# Patient Record
Sex: Female | Born: 1965 | Race: Asian | Hispanic: No | Marital: Married | Smoking: Never smoker
Health system: Southern US, Community
[De-identification: ages and names within clinical notes are randomized; demographics above are authoritative.]

## PROBLEM LIST (undated history)

## (undated) DIAGNOSIS — T7840XA Allergy, unspecified, initial encounter: Secondary | ICD-10-CM

## (undated) DIAGNOSIS — J45909 Unspecified asthma, uncomplicated: Secondary | ICD-10-CM

## (undated) DIAGNOSIS — R05 Cough: Secondary | ICD-10-CM

## (undated) DIAGNOSIS — R059 Cough, unspecified: Secondary | ICD-10-CM

## (undated) DIAGNOSIS — R0602 Shortness of breath: Secondary | ICD-10-CM

## (undated) HISTORY — DX: Shortness of breath: R06.02

## (undated) HISTORY — DX: Allergy, unspecified, initial encounter: T78.40XA

## (undated) HISTORY — DX: Cough: R05

## (undated) HISTORY — DX: Unspecified asthma, uncomplicated: J45.909

## (undated) HISTORY — PX: UPPER GASTROINTESTINAL ENDOSCOPY: SHX188

## (undated) HISTORY — PX: COLONOSCOPY: SHX174

## (undated) HISTORY — DX: Cough, unspecified: R05.9

---

## 2011-07-14 ENCOUNTER — Ambulatory Visit
Admission: RE | Admit: 2011-07-14 | Discharge: 2011-07-14 | Disposition: A | Discharge status: Home / Self Care | Payer: Self-pay | Source: Ambulatory Visit | Attending: Unknown Physician Specialty | Admitting: Unknown Physician Specialty

## 2011-07-14 ENCOUNTER — Other Ambulatory Visit: Payer: Self-pay | Admitting: Unknown Physician Specialty

## 2011-07-14 DIAGNOSIS — R05 Cough: Secondary | ICD-10-CM

## 2011-07-14 DIAGNOSIS — R053 Chronic cough: Secondary | ICD-10-CM

## 2011-08-14 ENCOUNTER — Other Ambulatory Visit: Payer: BC Managed Care – PPO

## 2011-08-14 ENCOUNTER — Encounter: Payer: Self-pay | Admitting: Critical Care Medicine

## 2011-08-14 ENCOUNTER — Ambulatory Visit (INDEPENDENT_AMBULATORY_CARE_PROVIDER_SITE_OTHER): Payer: BC Managed Care – PPO | Admitting: Critical Care Medicine

## 2011-08-14 ENCOUNTER — Telehealth: Payer: Self-pay | Admitting: Pulmonary Disease

## 2011-08-14 VITALS — BP 112/80 | HR 96 | Temp 98.1°F | Ht 62.0 in | Wt 116.2 lb

## 2011-08-14 DIAGNOSIS — J45902 Unspecified asthma with status asthmaticus: Secondary | ICD-10-CM

## 2011-08-14 DIAGNOSIS — R0602 Shortness of breath: Secondary | ICD-10-CM

## 2011-08-14 DIAGNOSIS — J449 Chronic obstructive pulmonary disease, unspecified: Secondary | ICD-10-CM | POA: Insufficient documentation

## 2011-08-14 DIAGNOSIS — J4541 Moderate persistent asthma with (acute) exacerbation: Secondary | ICD-10-CM | POA: Insufficient documentation

## 2011-08-14 DIAGNOSIS — R05 Cough: Secondary | ICD-10-CM

## 2011-08-14 DIAGNOSIS — R059 Cough, unspecified: Secondary | ICD-10-CM

## 2011-08-14 LAB — IGE: IgE (Immunoglobulin E), Serum: 323 IU/mL — ABNORMAL HIGH (ref 0.0–180.0)

## 2011-08-14 MED ORDER — BUDESONIDE-FORMOTEROL FUMARATE 160-4.5 MCG/ACT IN AERO: 2.0000 | INHALATION_SPRAY | Freq: Two times a day (BID) | RESPIRATORY_TRACT | Status: DC

## 2011-08-14 MED ORDER — AZITHROMYCIN 250 MG PO TABS: 250.0000 mg | ORAL_TABLET | Freq: Every day | ORAL | Status: AC

## 2011-08-14 MED ORDER — PREDNISONE 10 MG PO TABS: ORAL_TABLET | ORAL | Status: DC

## 2011-08-14 NOTE — Telephone Encounter (Signed)
Called and spoke with patient. Patient complaints of worsening breathing, SOB, hot flashes, productive cough with yellow mucus and chest tightness since last Friday.  Patient states she went and saw her family MD and was given antibiotic (doxycycline?) last week. Patient states she does not want to wait until 08/21/11 for scheduled appt with [facility].  Offered appt for today. Patient accepted. Appointment, MD and office address verified for today 08/14/2011 at 315 with Dr. Delford Field.  Nothing further needed at this time.

## 2011-08-14 NOTE — Patient Instructions (Addendum)
Start Symbicort two puff twice daily Take prednisone 10mg  Take 4 for three days 3 for three days 2 for three days 1 for three days and stop Stop levaquin Start Azithromycin 250mg  Take two once then one daily until gone Use Ventolin inhaler as needed only 1-2 puff every 4-6 hours Return [place]  One week  08/21/11

## 2011-08-14 NOTE — Assessment & Plan Note (Signed)
Severe persistent asthma with ongoing airway obstruction and lower airway inflammation. Triggers appear to be that of atopic hypersensitivity Plan Start Symbicort two puff twice daily Take prednisone 10mg  Take 4 for three days 3 for three days 2 for three days 1 for three days and stop Stop levaquin Start Azithromycin 250mg  Take two once then one daily until gone Use Ventolin inhaler as needed only 1-2 puff every 4-6 hours Return [place]  One week  08/21/11

## 2011-08-14 NOTE — Progress Notes (Signed)
Subjective:    Patient ID: Sara Trevino, female    DOB: 08/30/1965, 46 y.o.   MRN: [id number]  HPI Comments: Chronic cough since 10/12.  Then dyspnea worse three days ago and diff with breathing Pt saw ENT 2/13 [place]  Rx nasonex and omeprazole and no help Saw Allergy [facility]: allergy tests Rx Steroid shot and pred pulse and this helped. Company MD Rx Levaquin end of 6/13.   Pt saw PCP over weekend rx benzonatate, doxy, cough syrup, ventolin inhaler.>>this helps some  Cough This is a chronic problem. The current episode started more than 1 month ago. The problem has been rapidly worsening. The problem occurs every few minutes. The cough is productive of purulent sputum. Associated symptoms include chest pain, headaches, nasal congestion, postnasal drip, a sore throat, shortness of breath and wheezing. Pertinent negatives include no chills, ear congestion, ear pain, fever, heartburn, hemoptysis, myalgias, rash or rhinorrhea. The symptoms are aggravated by lying down, dust and fumes. She has tried a beta-agonist inhaler, prescription cough suppressant and OTC cough suppressant for the symptoms. The treatment provided moderate relief. There is no history of asthma, bronchiectasis, COPD, emphysema, environmental allergies or pneumonia. skin testing was not remarkable  Shortness of Breath This is a new problem. The current episode started in the past 7 days. The problem occurs daily. The problem has been rapidly worsening. Associated symptoms include chest pain, headaches, orthopnea, PND, a sore throat, vomiting and wheezing. Pertinent negatives include no abdominal pain, ear pain, fever, hemoptysis, leg swelling, neck pain, rash or rhinorrhea. The symptoms are aggravated by any activity, eating, lying flat, exercise, smoke and weather changes. She has tried beta agonist inhalers and oral steroids for the symptoms. The treatment provided moderate relief. There is no history of allergies, aspirin  allergies, asthma, chronic lung disease, COPD, a heart failure, PE or pneumonia. (Skin testing was not remarkable)   Past Medical History  Diagnosis Date  . Cough   . SOB (shortness of breath)      Family History  Problem Relation Age of Onset  . Positive PPD/TB Exposure Mother      History   Social History  . Marital Status: Married    Spouse Name: N/A    Number of Children: 1  . 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 Smoking status: Never Smoker   . Smokeless tobacco: Never Used  . Alcohol Use: No  . Drug Use: No  . Sexually Active: Not on file   Other Topics Concern  . Not on file   Social History Narrative  . No narrative on file     No Known Allergies   No outpatient prescriptions prior to visit.       Review of Systems  Constitutional: Positive for diaphoresis, activity change, appetite change and fatigue. Negative for fever, chills and unexpected weight change.  HENT: Positive for congestion, sore throat and postnasal drip. Negative for hearing loss, ear pain, nosebleeds, facial swelling, rhinorrhea, sneezing, mouth sores, trouble swallowing, neck pain, neck stiffness, dental problem, voice change, sinus pressure, tinnitus and ear discharge.   Eyes: Negative for photophobia, discharge, itching and visual disturbance.  Respiratory: Positive for cough, chest tightness, shortness of breath and wheezing. Negative for apnea, hemoptysis, choking and stridor.   Cardiovascular: Positive for chest pain, orthopnea and PND. Negative for palpitations and leg swelling.  Gastrointestinal: Positive for vomiting and abdominal distention. Negative for  heartburn, nausea, abdominal pain, constipation and blood in stool.  Genitourinary: Negative for dysuria, urgency, frequency, hematuria, flank pain, decreased urine volume and difficulty urinating.  Musculoskeletal: Negative for myalgias, back pain, joint  swelling, arthralgias and gait problem.  Skin: Negative for color change, pallor and rash.  Neurological: Positive for weakness and headaches. Negative for dizziness, tremors, seizures, syncope, speech difficulty, light-headedness and numbness.  Hematological: Negative for environmental allergies and adenopathy. Does not bruise/bleed easily.  Psychiatric/Behavioral: Positive for disturbed wake/sleep cycle. Negative for confusion and agitation. The patient is not nervous/anxious.        Objective:   Physical Exam  Filed Vitals:   08/14/11 1501  BP: 112/80  Pulse: 96  Temp: 98.1 F (36.7 C)  TempSrc: Oral  Height: 5\' 2"  (1.575 m)  Weight: 116 lb 3.2 oz (52.708 kg)  SpO2: 95%    Gen: Pleasant, well-nourished, in no distress,  normal affect  ENT: No lesions,  mouth clear,  oropharynx clear, no postnasal drip  Neck: No JVD, no TMG, no carotid bruits  Lungs: No use of accessory muscles, no dullness to percussion, expired and expiratory wheezes poor airflow  Cardiovascular: RRR, heart sounds normal, no murmur or gallops, no peripheral edema  Abdomen: soft and NT, no HSM,  BS normal  Musculoskeletal: No deformities, no cyanosis or clubbing  Neuro: alert, non focal  Skin: Warm, no lesions or rashes  Chest x-ray from June 2013-  revealed airway hyperinflation but no acute infiltrates      Assessment & Plan:   Extrinsic asthma with status asthmaticus Severe persistent asthma with ongoing airway obstruction and lower airway inflammation. Triggers appear to be that of atopic hypersensitivity Plan Start Symbicort two puff twice daily Take prednisone 10mg  Take 4 for three days 3 for three days 2 for three days 1 for three days and stop Stop levaquin Start Azithromycin 250mg  Take two once then one daily until gone Use Ventolin inhaler as needed only 1-2 puff every 4-6 hours Return [place]  One week  08/21/11    Updated Medication List Outpatient Encounter Prescriptions as  of 08/14/2011  Medication Sig Dispense Refill  . albuterol (VENTOLIN HFA) 108 (90 BASE) MCG/ACT inhaler Inhale 2 puffs into the lungs every 6 (six) hours as needed.      . benzonatate (TESSALON) 100 MG capsule Take by mouth every 12 (twelve) hours. UNSURE OF MG      . promethazine-codeine (PHENERGAN WITH CODEINE) 6.25-10 MG/5ML syrup Take by mouth at bedtime as needed.      Marland Kitchen azithromycin (ZITHROMAX) 250 MG tablet Take 1 tablet (250 mg total) by mouth daily. Take two once then one daily until gone  6 each  0  . budesonide-formoterol (SYMBICORT) 160-4.5 MCG/ACT inhaler Inhale 2 puffs into the lungs 2 (two) times daily.  1 Inhaler  12  . predniSONE (DELTASONE) 10 MG tablet Take 4 for three days 3 for three days 2 for three days 1 for three days and stop  30 tablet  0

## 2011-08-15 NOTE — Progress Notes (Signed)
Quick Note:  Called, spoke with pt. I informed her of lab results and recs per Dr. Delford Field. She is aware to cont with meds as rx on July 29 OV. I reminded her of pending OV with PW on Aug 5 in [place]. She verbalized understanding of these results and instructions. ______

## 2011-08-21 ENCOUNTER — Institutional Professional Consult (permissible substitution): Payer: Self-pay | Admitting: Pulmonary Disease

## 2011-08-21 ENCOUNTER — Ambulatory Visit (INDEPENDENT_AMBULATORY_CARE_PROVIDER_SITE_OTHER): Payer: BC Managed Care – PPO | Admitting: Critical Care Medicine

## 2011-08-21 ENCOUNTER — Encounter: Payer: Self-pay | Admitting: Critical Care Medicine

## 2011-08-21 VITALS — BP 102/68 | HR 73 | Temp 98.0°F | Ht 62.0 in | Wt 120.0 lb

## 2011-08-21 DIAGNOSIS — J45909 Unspecified asthma, uncomplicated: Secondary | ICD-10-CM

## 2011-08-21 NOTE — Assessment & Plan Note (Signed)
Severe persistent asthma with significant atopic features. IgE level greater than 300. Multiple positive allergens on recent skin testing. Plan Case discussed with patient's allergist. Consideration for Xolair therapy should be given in this case. Patient to maintain Symbicort daily Finish prednisone taper Return 2 months

## 2011-08-21 NOTE — Progress Notes (Signed)
Subjective:    Patient ID: Sara Trevino, female    DOB: 08/03/1965, 46 y.o.   MRN: [id number]  HPI Comments: Chronic cough since 10/12.  Then dyspnea worse three days ago and diff with breathing Pt saw ENT 2/13 [place]  Rx nasonex and omeprazole and no help Saw Allergy Sharma: allergy tests Rx Steroid shot and pred pulse and this helped. Company MD Rx Levaquin end of 6/13.   Pt saw PCP over weekend rx benzonatate, doxy, cough syrup, ventolin inhaler.>>this helps some  Cough This is a chronic problem. The current episode started more than 1 month ago. The problem has been rapidly worsening. The problem occurs every few minutes. The cough is productive of purulent sputum. Associated symptoms include chest pain, headaches, nasal congestion, postnasal drip, a sore throat, shortness of breath and wheezing. Pertinent negatives include no chills, ear congestion, ear pain, fever, heartburn, hemoptysis, myalgias, rash or rhinorrhea. The symptoms are aggravated by lying down, dust and fumes. She has tried a beta-agonist inhaler, prescription cough suppressant and OTC cough suppressant for the symptoms. The treatment provided moderate relief. There is no history of asthma, bronchiectasis, COPD, emphysema, environmental allergies or pneumonia. skin testing was not remarkable  Shortness of Breath This is a new problem. The current episode started in the past 7 days. The problem occurs daily. The problem has been rapidly worsening. Associated symptoms include chest pain, headaches, orthopnea, PND, a sore throat, vomiting and wheezing. Pertinent negatives include no abdominal pain, ear pain, fever, hemoptysis, leg swelling, neck pain, rash or rhinorrhea. The symptoms are aggravated by any activity, eating, lying flat, exercise, smoke and weather changes. She has tried beta agonist inhalers and oral steroids for the symptoms. The treatment provided moderate relief. There is no history of allergies, aspirin  allergies, asthma, chronic lung disease, COPD, a heart failure, PE or pneumonia. (Skin testing was not remarkable)  08/21/2011 Pt is coughing less and is better. On prednisone on a taper.  Ventolin is prn and not being used  PUL ASTHMA HISTORY 08/21/2011  Symptoms >2 days/week  Nighttime awakenings 0-2/month  Interference with activity Minor limitations  SABA use 0-2 days/wk  Exacerbations requiring oral steroids 0-1 / year     Past Medical History  Diagnosis Date  . Cough   . SOB (shortness of breath)      Family History  Problem Relation Age of Onset  . Positive PPD/TB Exposure Mother      History   Social History  . Marital Status: Married    Spouse Name: N/A    Number of Children: 1  . 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 Smoking status: Never Smoker   . Smokeless tobacco: Never Used  . Alcohol Use: No  . Drug Use: No  . Sexually Active: Not on file   Other Topics Concern  . Not on file   Social History Narrative  . No narrative on file     No Known Allergies   Outpatient Prescriptions Prior to Visit  Medication Sig Dispense Refill  . albuterol (VENTOLIN HFA) 108 (90 BASE) MCG/ACT inhaler Inhale 2 puffs into the lungs every 6 (six) hours as needed.      . benzonatate (TESSALON) 100 MG capsule Take by mouth daily. UNSURE OF MG      . budesonide-formoterol (SYMBICORT) 160-4.5 MCG/ACT inhaler Inhale 2 puffs into the lungs 2 (two) times daily.  1  Inhaler  12  . predniSONE (DELTASONE) 10 MG tablet Take 4 for three days 3 for three days 2 for three days 1 for three days and stop  30 tablet  0  . promethazine-codeine (PHENERGAN WITH CODEINE) 6.25-10 MG/5ML syrup Take by mouth at bedtime as needed.           Review of Systems  Constitutional: Positive for diaphoresis, activity change, appetite change and fatigue. Negative for fever, chills and unexpected weight change.  HENT: Positive for  congestion, sore throat and postnasal drip. Negative for hearing loss, ear pain, nosebleeds, facial swelling, rhinorrhea, sneezing, mouth sores, trouble swallowing, neck pain, neck stiffness, dental problem, voice change, sinus pressure, tinnitus and ear discharge.   Eyes: Negative for photophobia, discharge, itching and visual disturbance.  Respiratory: Positive for cough, chest tightness, shortness of breath and wheezing. Negative for apnea, hemoptysis, choking and stridor.   Cardiovascular: Positive for chest pain, orthopnea and PND. Negative for palpitations and leg swelling.  Gastrointestinal: Positive for vomiting and abdominal distention. Negative for heartburn, nausea, abdominal pain, constipation and blood in stool.  Genitourinary: Negative for dysuria, urgency, frequency, hematuria, flank pain, decreased urine volume and difficulty urinating.  Musculoskeletal: Negative for myalgias, back pain, joint swelling, arthralgias and gait problem.  Skin: Negative for color change, pallor and rash.  Neurological: Positive for weakness and headaches. Negative for dizziness, tremors, seizures, syncope, speech difficulty, light-headedness and numbness.  Hematological: Negative for environmental allergies and adenopathy. Does not bruise/bleed easily.  Psychiatric/Behavioral: Positive for disturbed wake/sleep cycle. Negative for confusion and agitation. The patient is not nervous/anxious.        Objective:   Physical Exam   Filed Vitals:   08/21/11 0848  BP: 102/68  Pulse: 73  Temp: 98 F (36.7 C)  TempSrc: Oral  Height: 5\' 2"  (1.575 m)  Weight: 120 lb (54.432 kg)  SpO2: 98%    Gen: Pleasant, well-nourished, in no distress,  normal affect  ENT: No lesions,  mouth clear,  oropharynx clear, no postnasal drip  Neck: No JVD, no TMG, no carotid bruits  Lungs: No use of accessory muscles, no dullness to percussion, improved airflow airflow  Cardiovascular: RRR, heart sounds normal, no  murmur or gallops, no peripheral edema  Abdomen: soft and NT, no HSM,  BS normal  Musculoskeletal: No deformities, no cyanosis or clubbing  Neuro: alert, non focal  Skin: Warm, no lesions or rashes  Chest x-ray from June 2013-  revealed airway hyperinflation but no acute infiltrates      Assessment & Plan:   Extrinsic asthma, unspecified Severe persistent asthma with significant atopic features. IgE level greater than 300. Multiple positive allergens on recent skin testing. Plan Case discussed with patient's allergist. Consideration for Xolair therapy should be given in this case. Patient to maintain Symbicort daily Finish prednisone taper Return 2 months   note we used specific interpreter so the patient can have a full understanding of her medical condition recommendation for treatments. It appears the patient only understands broken [place]. It will be important this patient have an interpreter in each office visit.  Updated Medication List Outpatient Encounter Prescriptions as of 08/21/2011  Medication Sig Dispense Refill  . albuterol (VENTOLIN HFA) 108 (90 BASE) MCG/ACT inhaler Inhale 2 puffs into the lungs every 6 (six) hours as needed.      . benzonatate (TESSALON) 100 MG capsule Take by mouth daily. UNSURE OF MG      . budesonide-formoterol (SYMBICORT) 160-4.5 MCG/ACT inhaler Inhale 2 puffs into  the lungs 2 (two) times daily.  1 Inhaler  12  . predniSONE (DELTASONE) 10 MG tablet Take 4 for three days 3 for three days 2 for three days 1 for three days and stop  30 tablet  0  . DISCONTD: promethazine-codeine (PHENERGAN WITH CODEINE) 6.25-10 MG/5ML syrup Take by mouth at bedtime as needed.

## 2011-08-21 NOTE — Patient Instructions (Signed)
Finish prednisone Stay on Symbicort, work on inhaler technique Call Dr [place] Callas for follow up to consider either Xolair or allergy shots Return 3 months [place] office

## 2011-08-22 ENCOUNTER — Telehealth: Payer: Self-pay | Admitting: Critical Care Medicine

## 2011-08-22 NOTE — Telephone Encounter (Signed)
Received 8 pages from [facility]. 08/22/2011/SD

## 2011-08-30 ENCOUNTER — Encounter (HOSPITAL_BASED_OUTPATIENT_CLINIC_OR_DEPARTMENT_OTHER): Payer: Self-pay | Admitting: Emergency Medicine

## 2011-08-30 ENCOUNTER — Emergency Department (HOSPITAL_BASED_OUTPATIENT_CLINIC_OR_DEPARTMENT_OTHER)
Admission: EM | Admit: 2011-08-30 | Discharge: 2011-08-30 | Disposition: A | Discharge status: Home / Self Care | Payer: BC Managed Care – PPO | Attending: Emergency Medicine | Admitting: Emergency Medicine

## 2011-08-30 DIAGNOSIS — S61209A Unspecified open wound of unspecified finger without damage to nail, initial encounter: Secondary | ICD-10-CM | POA: Insufficient documentation

## 2011-08-30 DIAGNOSIS — S61219A Laceration without foreign body of unspecified finger without damage to nail, initial encounter: Secondary | ICD-10-CM

## 2011-08-30 DIAGNOSIS — W260XXA Contact with knife, initial encounter: Secondary | ICD-10-CM | POA: Insufficient documentation

## 2011-08-30 NOTE — ED Notes (Signed)
Pt cut left thumb with kitchen knife while cutting a melon. Bleeding controlled.

## 2011-08-30 NOTE — ED Provider Notes (Signed)
History     CSN: [id number]  Arrival date & time 08/30/11  1859   First MD Initiated Contact with Patient 08/30/11 1924      Chief Complaint  Patient presents with  . Extremity Laceration    (Consider location/radiation/quality/duration/timing/severity/associated sxs/prior treatment) HPI Comments: Patient states she was cutting a watermelon this afternoon when the knife slipped and she cut her left thumb. The patient denies being on any blood thinning type medications. She's not had any previous procedures involving the left thumb. She has been able to control the bleeding with pressure. No other injury or problem reported.  The history is provided by the patient and the spouse.    Past Medical History  Diagnosis Date  . Cough   . SOB (shortness of breath)     History reviewed. No pertinent past surgical history.  Family History  Problem Relation Age of Onset  . Positive PPD/TB Exposure Mother     History  Substance Use Topics  . Smoking status: Never Smoker   . Smokeless tobacco: Never Used  . Alcohol Use: No    OB History    Grav Para Term Preterm Abortions TAB SAB Ect Mult Living                  Review of Systems  Constitutional: Negative for activity change.       All ROS Neg except as noted in HPI  HENT: Negative for nosebleeds and neck pain.   Eyes: Negative for photophobia and discharge.  Respiratory: Positive for cough and shortness of breath. Negative for wheezing.   Cardiovascular: Negative for chest pain and palpitations.  Gastrointestinal: Negative for abdominal pain and blood in stool.  Genitourinary: Negative for dysuria, frequency and hematuria.  Musculoskeletal: Negative for back pain and arthralgias.  Skin: Negative.   Neurological: Negative for dizziness, seizures and speech difficulty.  Psychiatric/Behavioral: Negative for hallucinations and confusion.    Allergies  Review of patient's allergies indicates no known allergies.  Home  Medications   Current Outpatient Rx  Name Route Sig Dispense Refill  . ALBUTEROL SULFATE HFA 108 (90 BASE) MCG/ACT IN AERS Inhalation Inhale 2 puffs into the lungs every 6 (six) hours as needed. For wheezing and shortness of breath.    . BUDESONIDE-FORMOTEROL FUMARATE 160-4.5 MCG/ACT IN AERO Inhalation Inhale 2 puffs into the lungs 2 (two) times daily. 1 Inhaler 12    BP 114/65  Pulse 68  Temp 98.6 F (37 C) (Oral)  Resp 18  Ht 5\' 2"  (1.575 m)  Wt 116 lb (52.617 kg)  BMI 21.22 kg/m2  SpO2 100%  LMP 08/23/2011  Physical Exam  Nursing note and vitals reviewed. Constitutional: She is oriented to person, place, and time. She appears well-developed and well-nourished.  Non-toxic appearance.  HENT:  Head: Normocephalic.  Right Ear: Tympanic membrane and external ear normal.  Left Ear: Tympanic membrane and external ear normal.  Eyes: EOM and lids are normal. Pupils are equal, round, and reactive to light.  Neck: Normal range of motion. Neck supple. Carotid bruit is not present.  Cardiovascular: Normal rate, regular rhythm, normal heart sounds, intact distal pulses and normal pulses.   Pulmonary/Chest: Breath sounds normal. No respiratory distress.  Abdominal: Soft. Bowel sounds are normal. There is no tenderness. There is no guarding.  Musculoskeletal: Normal range of motion.       Laceration to the lateral left thumb. No bone or tendon involvement. FROM of the thumb. Sensory wnl.  Lymphadenopathy:  Head (right side): No submandibular adenopathy present.       Head (left side): No submandibular adenopathy present.    She has no cervical adenopathy.  Neurological: She is alert and oriented to person, place, and time. She has normal strength. No cranial nerve deficit or sensory deficit.  Skin: Skin is warm and dry.  Psychiatric: She has a normal mood and affect. Her speech is normal.    ED Course  Procedures : LACERATION REPAIR LEFT THUMB - patient identified by arm band.  Permission for the procedure given by the patient. Procedural time out taken before repair of laceration to the left thumb. The left palm was cleansed with Hibiclens. The wound was repaired with Steri-Strips and tincture of benzoin with good wound edge approximation. The bleeding stopped, and the patient tolerated the procedure without any problem.  Labs Reviewed - No data to display No results found.   1. Laceration of finger       MDM  I have reviewed nursing notes, vital signs, and all appropriate lab and imaging results for this patient. Patient sustained a laceration to the left thumb while cutting watermelon. The wound was repaired with Steri-Strips and tincture of benzoin. The patient has been advised on monitoring the wound for any signs of infection. She is to return if any changes, problems, or concerns.       Kathie Dike, [place] 08/30/11 2113

## 2011-09-03 NOTE — ED Provider Notes (Signed)
Medical screening examination/treatment/procedure(s) were performed by non-physician practitioner and as supervising physician I was immediately available for consultation/collaboration.   Shelda Jakes, MD 09/03/11 [phone]

## 2011-09-19 ENCOUNTER — Telehealth: Payer: Self-pay | Admitting: Critical Care Medicine

## 2011-09-19 NOTE — Telephone Encounter (Signed)
Forward 4 pages from [facility] to Dr. Shan Levans for review on 09-19-11 ym

## 2011-12-28 ENCOUNTER — Ambulatory Visit (INDEPENDENT_AMBULATORY_CARE_PROVIDER_SITE_OTHER): Payer: BC Managed Care – PPO | Admitting: Critical Care Medicine

## 2011-12-28 ENCOUNTER — Encounter: Payer: Self-pay | Admitting: Critical Care Medicine

## 2011-12-28 VITALS — BP 114/64 | HR 80 | Temp 97.7°F | Ht 62.0 in | Wt 124.0 lb

## 2011-12-28 DIAGNOSIS — J45909 Unspecified asthma, uncomplicated: Secondary | ICD-10-CM

## 2011-12-28 MED ORDER — MOMETASONE FUROATE 220 MCG/INH IN AEPB: 2.0000 | INHALATION_SPRAY | Freq: Every day | RESPIRATORY_TRACT | Status: DC

## 2011-12-28 MED ORDER — AZELASTINE-FLUTICASONE 137-50 MCG/ACT NA SUSP: 1.0000 | Freq: Two times a day (BID) | NASAL | Status: DC

## 2011-12-28 NOTE — Patient Instructions (Signed)
Stop nasonex Try Dymista one puff twice daily Stop Symbicort Start Asmanex two puff daily Return 4 months

## 2011-12-28 NOTE — Progress Notes (Signed)
Subjective:    Patient ID: Sara Trevino, female    DOB: May 06, 1965, 46 y.o.   MRN: [id number]  Shortness of Breath This is a new problem. The current episode started in the past 7 days. The problem occurs daily. The problem has been rapidly worsening. Associated symptoms include orthopnea, PND and vomiting. Pertinent negatives include no abdominal pain, leg swelling or neck pain. The symptoms are aggravated by any activity, eating, lying flat, exercise, smoke and weather changes. She has tried beta agonist inhalers and oral steroids for the symptoms. The treatment provided moderate relief. There is no history of allergies, aspirin allergies, chronic lung disease, a heart failure or PE.  08/21/2011 Pt is coughing less and is better. On prednisone on a taper.  Ventolin is prn and not being used   12/28/2011 Breathing is ok, Pt still with pndrip. In the AM has yellow mucus. This is an issue for one week. No chest tightness.  No cough. No wheeze.  No SABA use, just symbicort. Started nasonex one week ago ? If helps.  PUL ASTHMA HISTORY 08/21/2011  Symptoms >2 days/week  Nighttime awakenings 0-2/month  Interference with activity Minor limitations  SABA use 0-2 days/wk  Exacerbations requiring oral steroids 0-1 / year     Past Medical History  Diagnosis Date  . Cough   . SOB (shortness of breath)      Family History  Problem Relation Age of Onset  . Positive PPD/TB Exposure Mother      History   Social History  . Marital Status: Married    Spouse Name: N/A    Number of Children: 1  . 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 Smoking status: Never Smoker   . Smokeless tobacco: Never Used  . Alcohol Use: No  . Drug Use: No  . Sexually Active: Not on file   Other Topics Concern  . Not on file   Social History Narrative  . No narrative on file     No Known Allergies   Outpatient Prescriptions Prior to Visit   Medication Sig Dispense Refill  . albuterol (VENTOLIN HFA) 108 (90 BASE) MCG/ACT inhaler Inhale 2 puffs into the lungs every 6 (six) hours as needed. For wheezing and shortness of breath.      . [DISCONTINUED] budesonide-formoterol (SYMBICORT) 160-4.5 MCG/ACT inhaler Inhale 2 puffs into the lungs 2 (two) times daily.  1 Inhaler  12  Last reviewed on 12/28/2011  4:13 PM by Storm Frisk, MD     Review of Systems  Constitutional: Positive for diaphoresis, activity change, appetite change and fatigue. Negative for unexpected weight change.  HENT: Positive for congestion. Negative for hearing loss, nosebleeds, facial swelling, sneezing, mouth sores, trouble swallowing, neck pain, neck stiffness, dental problem, voice change, sinus pressure, tinnitus and ear discharge.   Eyes: Negative for photophobia, discharge, itching and visual disturbance.  Respiratory: Negative for apnea, choking, chest tightness and stridor.   Cardiovascular: Positive for orthopnea and PND. Negative for palpitations and leg swelling.  Gastrointestinal: Positive for vomiting and abdominal distention. Negative for nausea, abdominal pain, constipation and blood in stool.  Genitourinary: Negative for dysuria, urgency, frequency, hematuria, flank pain, decreased urine volume and difficulty urinating.  Musculoskeletal: Negative for back pain, joint swelling, arthralgias and gait problem.  Skin: Negative for color change and pallor.  Neurological: Positive for weakness. Negative for dizziness, tremors, seizures, syncope, speech difficulty, light-headedness  and numbness.  Hematological: Negative for adenopathy. Does not bruise/bleed easily.  Psychiatric/Behavioral: Positive for sleep disturbance. Negative for confusion and agitation. The patient is not nervous/anxious.        Objective:   Physical Exam   Filed Vitals:   12/28/11 1552  BP: 114/64  Pulse: 80  Temp: 97.7 F (36.5 C)  TempSrc: Oral  Height: 5\' 2"  (1.575  m)  Weight: 124 lb (56.246 kg)  SpO2: 99%    Gen: Pleasant, well-nourished, in no distress,  normal affect  ENT: No lesions,  mouth clear,  oropharynx clear, no postnasal drip  Neck: No JVD, no TMG, no carotid bruits  Lungs: No use of accessory muscles, no dullness to percussion, improved airflow airflow  Cardiovascular: RRR, heart sounds normal, no murmur or gallops, no peripheral edema  Abdomen: soft and NT, no HSM,  BS normal  Musculoskeletal: No deformities, no cyanosis or clubbing  Neuro: alert, non focal  Skin: Warm, no lesions or rashes  Chest x-ray from June 2013-  revealed airway hyperinflation but no acute infiltrates      Assessment & Plan:   Extrinsic asthma, unspecified Persistent asthma with improvement Plan Discontinue Symbicort Begin Asmanex 2 puff daily Trial of Dymista  1 spray each nostril twice daily in place of Nasonex    Updated Medication List Outpatient Encounter Prescriptions as of 12/28/2011  Medication Sig Dispense Refill  . albuterol (VENTOLIN HFA) 108 (90 BASE) MCG/ACT inhaler Inhale 2 puffs into the lungs every 6 (six) hours as needed. For wheezing and shortness of breath.      . Multiple Vitamin (MULTIVITAMIN) tablet Take 1 tablet by mouth daily.      Marland Kitchen PRESCRIPTION MEDICATION Allergy injections once weekly      . [DISCONTINUED] budesonide-formoterol (SYMBICORT) 160-4.5 MCG/ACT inhaler Inhale 2 puffs into the lungs 2 (two) times daily.  1 Inhaler  12  . [DISCONTINUED] budesonide-formoterol (SYMBICORT) 160-4.5 MCG/ACT inhaler Inhale 1 puff into the lungs daily.      . [DISCONTINUED] mometasone (NASONEX) 50 MCG/ACT nasal spray Place 2 sprays into the nose at bedtime.      . Azelastine-Fluticasone (DYMISTA) 137-50 MCG/ACT SUSP Place 1 puff into the nose 2 (two) times daily.  23 g  1  . mometasone (ASMANEX 120 METERED DOSES) 220 MCG/INH inhaler Inhale 2 puffs into the lungs daily.  1 Inhaler  12

## 2011-12-29 NOTE — Assessment & Plan Note (Signed)
Persistent asthma with improvement Plan Discontinue Symbicort Begin Asmanex 2 puff daily Trial of Dymista  1 spray each nostril twice daily in place of Nasonex

## 2012-01-01 ENCOUNTER — Ambulatory Visit: Payer: BC Managed Care – PPO | Admitting: Critical Care Medicine

## 2012-05-01 ENCOUNTER — Telehealth: Payer: Self-pay | Admitting: Critical Care Medicine

## 2012-05-01 NOTE — Telephone Encounter (Signed)
Called patient 3x to schd follow up apt. No return call back. Sent letter 05/01/12. °

## 2014-01-05 ENCOUNTER — Encounter (INDEPENDENT_AMBULATORY_CARE_PROVIDER_SITE_OTHER): Payer: Self-pay

## 2014-01-05 ENCOUNTER — Encounter: Payer: Self-pay | Admitting: Critical Care Medicine

## 2014-01-05 ENCOUNTER — Ambulatory Visit (INDEPENDENT_AMBULATORY_CARE_PROVIDER_SITE_OTHER): Payer: BC Managed Care – PPO | Admitting: Critical Care Medicine

## 2014-01-05 VITALS — BP 100/62 | HR 71 | Temp 98.1°F | Ht 62.0 in | Wt 121.4 lb

## 2014-01-05 DIAGNOSIS — Z23 Encounter for immunization: Secondary | ICD-10-CM

## 2014-01-05 DIAGNOSIS — J0191 Acute recurrent sinusitis, unspecified: Secondary | ICD-10-CM

## 2014-01-05 DIAGNOSIS — J4541 Moderate persistent asthma with (acute) exacerbation: Secondary | ICD-10-CM

## 2014-01-05 MED ORDER — CHLORPHENIRAMINE MALEATE 4 MG PO TABS: 12.0000 mg | ORAL_TABLET | Freq: Every day | ORAL | Status: DC

## 2014-01-05 MED ORDER — PREDNISONE 10 MG PO TABS: ORAL_TABLET | ORAL | Status: DC

## 2014-01-05 MED ORDER — BENZONATATE 100 MG PO CAPS: 100.0000 mg | ORAL_CAPSULE | Freq: Four times a day (QID) | ORAL | Status: DC | PRN

## 2014-01-05 NOTE — Progress Notes (Signed)
Subjective:    Patient ID: Sara Trevino, female    DOB: 04/16/1965, 48 y.o.   MRN: [id number]  HPI Comments: Hx of cough chronic referred back by allergy MD, pt is on allergy immunotherapy.  Pt sees McIntosh CallasSharma.  Also has seen Dr Newman PiesSu Teoh of ENT who said no surgery needed   Cough This is a chronic problem. The current episode started more than 1 month ago. The problem has been unchanged. The cough is productive of sputum (yellow to white mucus). Associated symptoms include nasal congestion, postnasal drip, a sore throat, shortness of breath and wheezing. Pertinent negatives include no chest pain, chills, ear congestion, ear pain, fever, headaches, heartburn, hemoptysis, rash, rhinorrhea, sweats or weight loss. Associated symptoms comments: Notes qhs cough. The symptoms are aggravated by cold air, fumes, exercise, dust, pollens, lying down and stress. Risk factors for lung disease include smoking/tobacco exposure. She has tried steroid inhaler, a beta-agonist inhaler and oral steroids (symbicort did not help>>now on breo , ABX and steroids help but not relieved ) for the symptoms. The treatment provided moderate relief. Her past medical history is significant for asthma and environmental allergies.    PUL ASTHMA HISTORY 08/21/2011  Symptoms >2 days/week  Nighttime awakenings 0-2/month  Interference with activity Minor limitations  SABA use 0-2 days/wk  Exacerbations requiring oral steroids 0-1 / year     Past Medical History  Diagnosis Date  . Cough   . SOB (shortness of breath)      Family History  Problem Relation Age of Onset  . Positive PPD/TB Exposure Mother      History   Social History  . Marital Status: Married    Spouse Name: N/A    Number of Children: 1  . 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 Smoking status: Never Smoker   . Smokeless tobacco: Never Used  . Alcohol Use: No  . Drug Use: No  . Sexual  Activity: Not on file   Other Topics Concern  . Not on file   Social History Narrative     No Known Allergies   Outpatient Prescriptions Prior to Visit  Medication Sig Dispense Refill  . albuterol (VENTOLIN HFA) 108 (90 BASE) MCG/ACT inhaler Inhale 2 puffs into the lungs every 6 (six) hours as needed. For wheezing and shortness of breath.    . Multiple Vitamin (MULTIVITAMIN) tablet Take 1 tablet by mouth daily.    Marland Kitchen. PRESCRIPTION MEDICATION Allergy injections once weekly    . Azelastine-Fluticasone (DYMISTA) 137-50 MCG/ACT SUSP Place 1 puff into the nose 2 (two) times daily. 23 g 1  . mometasone (ASMANEX 120 METERED DOSES) 220 MCG/INH inhaler Inhale 2 puffs into the lungs daily. (Patient not taking: Reported on 01/05/2014) 1 Inhaler 12   No facility-administered medications prior to visit.       Review of Systems  Constitutional: Positive for diaphoresis, activity change, appetite change and fatigue. Negative for fever, chills, weight loss and unexpected weight change.  HENT: Positive for congestion, postnasal drip and sore throat. Negative for dental problem, ear discharge, ear pain, facial swelling, hearing loss, mouth sores, nosebleeds, rhinorrhea, sinus pressure, sneezing, tinnitus, trouble swallowing and voice change.   Eyes: Negative for photophobia, discharge, itching and visual disturbance.  Respiratory: Positive for cough, shortness of breath and wheezing. Negative for apnea, hemoptysis, choking, chest tightness and stridor.   Cardiovascular: Negative for chest pain and palpitations.  Gastrointestinal: Positive for abdominal distention. Negative for heartburn, nausea, constipation and blood in stool.  Genitourinary: Negative for dysuria, urgency, frequency, hematuria, flank pain, decreased urine volume and difficulty urinating.  Musculoskeletal: Negative for back pain, joint swelling, arthralgias, gait problem and neck stiffness.  Skin: Negative for color change, pallor and  rash.  Allergic/Immunologic: Positive for environmental allergies.  Neurological: Positive for weakness. Negative for dizziness, tremors, seizures, syncope, speech difficulty, light-headedness, numbness and headaches.  Hematological: Negative for adenopathy. Does not bruise/bleed easily.  Psychiatric/Behavioral: Positive for sleep disturbance. Negative for confusion and agitation. The patient is not nervous/anxious.        Objective:   Physical Exam   Filed Vitals:   01/05/14 0943  BP: 100/62  Pulse: 71  Temp: 98.1 F (36.7 C)  TempSrc: Oral  Height: 5\' 2"  (1.575 m)  Weight: 55.067 kg (121 lb 6.4 oz)  SpO2: 98%    Gen: Pleasant, well-nourished, in no distress,  normal affect  ENT: No lesions,  mouth clear,  oropharynx clear, no postnasal drip  Neck: No JVD, no TMG, no carotid bruits  Lungs: No use of accessory muscles, no dullness to percussion, improved airflow airflow  Cardiovascular: RRR, heart sounds normal, no murmur or gallops, no peripheral edema  Abdomen: soft and NT, no HSM,  BS normal  Musculoskeletal: No deformities, no cyanosis or clubbing  Neuro: alert, non focal  Skin: Warm, no lesions or rashes  CT chest 2015: normal CT Sinus: 2014: polyposis, mucosal thickening      Assessment & Plan:   Moderate persistent asthma with exacerbation Moderate persistent asthma with exacerabation Plan Use tessalon perles as needed for cough 1 - 2 every 4-6 hours Recycle prednisone 10mg  Take 4 for four days 3 for four days 2 for four days 1 for four days Finish Biaxin Stay on Breo Stay on Qnasl Use chlorpheniramine 4mg  : 12mg  at bedtime A CT Sinus will be obtained Return 2 weeks recheck Tammy Parrett     Updated Medication List Outpatient Encounter Prescriptions as of 01/05/2014  Medication Sig  . albuterol (VENTOLIN HFA) 108 (90 BASE) MCG/ACT inhaler Inhale 2 puffs into the lungs every 6 (six) hours as needed. For wheezing and shortness of breath.  .  Beclomethasone Dipropionate (QNASL) 80 MCG/ACT AERS Place 2 puffs into the nose daily.  Marland Kitchen. BREO ELLIPTA 200-25 MCG/INH AEPB Inhale 1 puff into the lungs daily.  . clarithromycin (BIAXIN) 500 MG tablet Take 500 mg by mouth 2 (two) times daily.  . Multiple Vitamin (MULTIVITAMIN) tablet Take 1 tablet by mouth daily.  . predniSONE (DELTASONE) 10 MG tablet Take 4 for four days 3 for four days 2 for four days 1 for four days  . PRESCRIPTION MEDICATION Allergy injections once weekly  . [DISCONTINUED] Azelastine-Fluticasone (DYMISTA) 137-50 MCG/ACT SUSP Place 1 puff into the nose 2 (two) times daily.  . [DISCONTINUED] predniSONE (DELTASONE) 10 MG tablet Take 10 mg by mouth as directed.  . benzonatate (TESSALON) 100 MG capsule Take 1 capsule (100 mg total) by mouth every 6 (six) hours as needed for cough.  . chlorpheniramine (CHLOR-TRIMETON) 4 MG tablet Take 3 tablets (12 mg total) by mouth at bedtime.  . [DISCONTINUED] mometasone (ASMANEX 120 METERED DOSES) 220 MCG/INH inhaler Inhale 2 puffs into the lungs daily. (Patient not taking: Reported on 01/05/2014)

## 2014-01-05 NOTE — Assessment & Plan Note (Signed)
Moderate persistent asthma with exacerabation Plan Use tessalon perles as needed for cough 1 - 2 every 4-6 hours Recycle prednisone 10mg  Take 4 for four days 3 for four days 2 for four days 1 for four days Finish Biaxin Stay on Breo Stay on Qnasl Use chlorpheniramine 4mg  : 12mg  at bedtime A CT Sinus will be obtained Return 2 weeks recheck Tammy Parrett

## 2014-01-05 NOTE — Patient Instructions (Addendum)
Use tessalon perles as needed for cough 1 - 2 every 4-6 hours Recycle prednisone 10mg  Take 4 for four days 3 for four days 2 for four days 1 for four days Finish Biaxin Stay on Breo Stay on Qnasl Use chlorpheniramine 4mg  : 12mg  at bedtime A CT Sinus will be obtained Return 2 weeks recheck Sara Trevino

## 2014-01-12 ENCOUNTER — Ambulatory Visit (INDEPENDENT_AMBULATORY_CARE_PROVIDER_SITE_OTHER)
Admission: RE | Admit: 2014-01-12 | Discharge: 2014-01-12 | Disposition: A | Discharge status: Home / Self Care | Payer: BC Managed Care – PPO | Source: Ambulatory Visit | Attending: Critical Care Medicine | Admitting: Critical Care Medicine

## 2014-01-12 DIAGNOSIS — J0191 Acute recurrent sinusitis, unspecified: Secondary | ICD-10-CM

## 2014-01-14 ENCOUNTER — Telehealth: Payer: Self-pay | Admitting: Critical Care Medicine

## 2014-01-14 NOTE — Progress Notes (Signed)
Quick Note:  Pt aware of results - see 01/14/14 phome msg. ______

## 2014-01-14 NOTE — Telephone Encounter (Signed)
Notes Recorded by Storm FriskPatrick E Wright, MD on 01/14/2014 at 10:34 AM Let pt know CT Sinus shows only severe allergies in sinuses, no chronic infection or polyps or sinusitis  -------  Called, spoke with pt.  Discussed above results and recs per Dr. Delford FieldWright.  She verbalized understanding and voiced no further questions or concerns at this time.

## 2014-01-14 NOTE — Progress Notes (Signed)
Quick Note:  lmomtcb for pt ______ 

## 2014-01-19 ENCOUNTER — Encounter: Payer: Self-pay | Admitting: Adult Health

## 2014-01-19 ENCOUNTER — Ambulatory Visit (INDEPENDENT_AMBULATORY_CARE_PROVIDER_SITE_OTHER): Payer: BLUE CROSS/BLUE SHIELD | Admitting: Adult Health

## 2014-01-19 VITALS — BP 126/80 | HR 89 | Temp 97.9°F | Ht 61.0 in | Wt 120.0 lb

## 2014-01-19 DIAGNOSIS — J4541 Moderate persistent asthma with (acute) exacerbation: Secondary | ICD-10-CM

## 2014-01-19 NOTE — Assessment & Plan Note (Signed)
Recurrent flare , now improved  Control for cough triggers   Plan  May begin Allegra  daily  Follow up Dr. Delford Field  In 3-4 weeks with PFT

## 2014-01-19 NOTE — Patient Instructions (Signed)
May begin Allegra  daily  Follow up Dr. Delford Field  In 3-4 weeks with PFT

## 2014-01-19 NOTE — Progress Notes (Signed)
   Subjective:    Patient ID: Sara Trevino, female    DOB: 1965-07-28, 49 y.o.   MRN: [id number]  HPI  49 yo with cough chronic and allergies  Followed by allergy MD, pt is on allergy immunotherapy   01/19/2014  Seen 2 weeks ago for pulmonary consult for cough for 1 month . Says over last year several flares of cough that improves with steroids but then slowly comes back .  Is on BREO, QNASL , Tessalon .  Previously tried singulair with no help.  Use allegra on /off. Has been treated with steroids and abx with no resolution .  Last visit was set up for sinus which showed no acute process .  Treated with cough control , rhinitis prevention and steroid taper. Says she is doing much better. Cough is gone.  Is worried that this cough keeps coming back.  Wonders does she really have asthma or not.  Had a recent CT chest says it was normal . No records at today's visit.    Review of Systems Constitutional:   No  weight loss, night sweats,  Fevers, chills, fatigue, or  lassitude.  HEENT:   No headaches,  Difficulty swallowing,  Tooth/dental problems, or  Sore throat,                No sneezing, itching, ear ache, }+ nasal congestion, post nasal drip,   CV:  No chest pain,  Orthopnea, PND, swelling in lower extremities, anasarca, dizziness, palpitations, syncope.   GI  No heartburn, indigestion, abdominal pain, nausea, vomiting, diarrhea, change in bowel habits, loss of appetite, bloody stools.   Resp: No shortness of breath with exertion or at rest.  No excess mucus, no productive cough,  No non-productive cough,  No coughing up of blood.  No change in color of mucus.  No wheezing.  No chest wall deformity  Skin: no rash or lesions.  GU: no dysuria, change in color of urine, no urgency or frequency.  No flank pain, no hematuria   MS:  No joint pain or swelling.  No decreased range of motion.  No back pain.  Psych:  No change in mood or affect. No depression or anxiety.  No memory  loss.         Objective:   Physical Exam GEN: A/Ox3; pleasant , NAD, well nourished   HEENT:  [place]/AT,  EACs-clear, TMs-wnl, NOSE-clear, THROAT-clear, no lesions, no postnasal drip or exudate noted.   NECK:  Supple w/ fair ROM; no JVD; normal carotid impulses w/o bruits; no thyromegaly or nodules palpated; no lymphadenopathy.  RESP  Clear  P & A; w/o, wheezes/ rales/ or rhonchi.no accessory muscle use, no dullness to percussion  CARD:  RRR, no m/r/g  , no peripheral edema, pulses intact, no cyanosis or clubbing.  GI:   Soft & nt; nml bowel sounds; no organomegaly or masses detected.  Musco: Warm bil, no deformities or joint swelling noted.   Neuro: alert, no focal deficits noted.    Skin: Warm, no lesions or rashes         Assessment & Plan:

## 2014-02-20 ENCOUNTER — Other Ambulatory Visit: Payer: Self-pay | Admitting: Critical Care Medicine

## 2014-02-20 DIAGNOSIS — R0600 Dyspnea, unspecified: Secondary | ICD-10-CM

## 2014-02-23 ENCOUNTER — Ambulatory Visit (INDEPENDENT_AMBULATORY_CARE_PROVIDER_SITE_OTHER): Payer: BLUE CROSS/BLUE SHIELD | Admitting: Critical Care Medicine

## 2014-02-23 ENCOUNTER — Encounter: Payer: Self-pay | Admitting: Critical Care Medicine

## 2014-02-23 ENCOUNTER — Encounter (INDEPENDENT_AMBULATORY_CARE_PROVIDER_SITE_OTHER): Payer: Self-pay

## 2014-02-23 VITALS — BP 122/70 | HR 105 | Temp 98.4°F | Ht 64.0 in | Wt 121.6 lb

## 2014-02-23 DIAGNOSIS — J4541 Moderate persistent asthma with (acute) exacerbation: Secondary | ICD-10-CM

## 2014-02-23 DIAGNOSIS — R0600 Dyspnea, unspecified: Secondary | ICD-10-CM

## 2014-02-23 LAB — PULMONARY FUNCTION TEST
DL/VA % PRED: 120 %
DL/VA: 5.79 ml/min/mmHg/L
DLCO UNC % PRED: 95 %
DLCO UNC: 23.21 ml/min/mmHg
FEF 25-75 Post: 1.73 L/sec
FEF 25-75 Pre: 0.91 L/sec
FEF2575-%CHANGE-POST: 89 %
FEF2575-%PRED-POST: 66 %
FEF2575-%PRED-PRE: 35 %
FEV1-%Change-Post: 19 %
FEV1-%PRED-PRE: 60 %
FEV1-%Pred-Post: 72 %
FEV1-Post: 1.87 L
FEV1-Pre: 1.56 L
FEV1FVC-%CHANGE-POST: 1 %
FEV1FVC-%PRED-PRE: 84 %
FEV6-%CHANGE-POST: 18 %
FEV6-Post: 2.67 L
FEV6-Pre: 2.25 L
FEV6FVC-%Change-Post: 0 %
FVC-%Change-Post: 17 %
FVC-%Pred-Post: 84 %
FVC-%Pred-Pre: 71 %
FVC-Post: 2.68 L
FVC-Pre: 2.28 L
POST FEV1/FVC RATIO: 70 %
POST FEV6/FVC RATIO: 100 %
Pre FEV1/FVC ratio: 68 %
Pre FEV6/FVC Ratio: 99 %
RV % pred: 162 %
RV: 2.9 L
TLC % PRED: 104 %
TLC: 5.25 L

## 2014-02-23 MED ORDER — BECLOMETHASONE DIPROPIONATE 80 MCG/ACT NA AERS: 2.0000 | INHALATION_SPRAY | Freq: Every day | NASAL | Status: DC

## 2014-02-23 MED ORDER — MOMETASONE FUROATE 220 MCG/INH IN AEPB: 2.0000 | INHALATION_SPRAY | Freq: Every day | RESPIRATORY_TRACT | Status: DC

## 2014-02-23 MED ORDER — ALBUTEROL SULFATE HFA 108 (90 BASE) MCG/ACT IN AERS: 2.0000 | INHALATION_SPRAY | Freq: Four times a day (QID) | RESPIRATORY_TRACT | Status: DC | PRN

## 2014-02-23 NOTE — Progress Notes (Signed)
Subjective:    Patient ID: Sara Trevino, female    DOB: 1965/02/23, 49 y.o.   MRN: [id number]  HPI  49 yo with cough chronic and allergies  Followed by allergy MD, pt is on allergy immunotherapy   02/23/2014 Chief Complaint  Patient presents with  . Follow-up    with PFTs.  Congestion and cough with yelllow to white mucus and trouble breathing in the evenings x 3 - 5 days.    Pt still coughing 3 days ago,  Prior to this was well.  PFTs done: severe obstruction with BD reversability. Mucus is yellow to white.  No nasal drainage.  No real chest pain. Pt notes difficulty with breathing.  No real edema in feet.  Pt notes no wheezing.   PUL ASTHMA HISTORY 02/23/2014 08/21/2011  Symptoms Throughout the day >2 days/week  Nighttime awakenings >1/wk but not nightly 0-2/month  Interference with activity Some limitations Minor limitations  SABA use Daily 0-2 days/wk  Exacerbations requiring oral steroids 2 or more / year 0-1 / year      Review of Systems Constitutional:   No  weight loss, night sweats,  Fevers, chills, fatigue, or  lassitude.  HEENT:   No headaches,  Difficulty swallowing,  Tooth/dental problems, or  Sore throat,                No sneezing, itching, ear ache, }+ nasal congestion, post nasal drip,   CV:  No chest pain,  Orthopnea, PND, swelling in lower extremities, anasarca, dizziness, palpitations, syncope.   GI  No heartburn, indigestion, abdominal pain, nausea, vomiting, diarrhea, change in bowel habits, loss of appetite, bloody stools.   Resp: No shortness of breath with exertion or at rest.  No excess mucus, no productive cough,  No non-productive cough,  No coughing up of blood.  No change in color of mucus.  No wheezing.  No chest wall deformity  Skin: no rash or lesions.  GU: no dysuria, change in color of urine, no urgency or frequency.  No flank pain, no hematuria   MS:  No joint pain or swelling.  No decreased range of motion.  No back pain.  Psych:  No  change in mood or affect. No depression or anxiety.  No memory loss.         Objective:   Physical Exam GEN: A/Ox3; pleasant , NAD, well nourished   HEENT:  [place]/AT,  EACs-clear, TMs-wnl, NOSE-clear, THROAT-clear, no lesions, no postnasal drip or exudate noted.   NECK:  Supple w/ fair ROM; no JVD; normal carotid impulses w/o bruits; no thyromegaly or nodules palpated; no lymphadenopathy.  RESP  Clear  P & A; w/o, wheezes/ rales/ or rhonchi.no accessory muscle use, no dullness to percussion  CARD:  RRR, no m/r/g  , no peripheral edema, pulses intact, no cyanosis or clubbing.  GI:   Soft & nt; nml bowel sounds; no organomegaly or masses detected.  Musco: Warm bil, no deformities or joint swelling noted.   Neuro: alert, no focal deficits noted.    Skin: Warm, no lesions or rashes         Assessment & Plan:   Moderate persistent asthma with exacerbation Severe persistent asthma with reversable airflow obstruction Pt with very poor understanding of disease process, poor insight, health care illiteracy I had an extended discussion with the patient reviewing all relevant studies completed to date and  lasting 15 to 20 minutes of a 25 minute visit on the following ongoing concerns:  dx of asthma, disease process, importance of pt activation maintenance of therapy with ICS, pt overthinking process and stopping meds on her own Plan Asmanex two puff daily QNasl two puff daily each nostril Stop Breo Refill on albuterol to use as needed    Updated Medication List Outpatient Encounter Prescriptions as of 02/23/2014  Medication Sig  . albuterol (VENTOLIN HFA) 108 (90 BASE) MCG/ACT inhaler Inhale 2 puffs into the lungs every 6 (six) hours as needed. For wheezing and shortness of breath.  . Beclomethasone Dipropionate (QNASL) 80 MCG/ACT AERS Place 2 puffs into the nose daily.  . Multiple Vitamin (MULTIVITAMIN) tablet Take 1 tablet by mouth daily.  Marland Kitchen. PRESCRIPTION MEDICATION Allergy  injections once weekly  . [DISCONTINUED] albuterol (VENTOLIN HFA) 108 (90 BASE) MCG/ACT inhaler Inhale 2 puffs into the lungs every 6 (six) hours as needed. For wheezing and shortness of breath.  . [DISCONTINUED] Beclomethasone Dipropionate (QNASL) 80 MCG/ACT AERS Place 2 puffs into the nose daily.  . [DISCONTINUED] BREO ELLIPTA 200-25 MCG/INH AEPB Inhale 1 puff into the lungs daily.  . mometasone (ASMANEX 120 METERED DOSES) 220 MCG/INH inhaler Inhale 2 puffs into the lungs daily.  . [DISCONTINUED] benzonatate (TESSALON) 100 MG capsule Take 1 capsule (100 mg total) by mouth every 6 (six) hours as needed for cough. (Patient not taking: Reported on 02/23/2014)

## 2014-02-23 NOTE — Progress Notes (Signed)
PFT done today. 

## 2014-02-23 NOTE — Patient Instructions (Signed)
Asmanex two puff daily QNasl two puff daily each nostril Stop Breo Refill on albuterol to use as needed Your diagnosis is atopic allergic asthma Return 3 months

## 2014-02-23 NOTE — Assessment & Plan Note (Signed)
Severe persistent asthma with reversable airflow obstruction Pt with very poor understanding of disease process, poor insight, health care illiteracy I had an extended discussion with the patient reviewing all relevant studies completed to date and  lasting 15 to 20 minutes of a 25 minute visit on the following ongoing concerns: dx of asthma, disease process, importance of pt activation maintenance of therapy with ICS, pt overthinking process and stopping meds on her own Plan Asmanex two puff daily QNasl two puff daily each nostril Stop Breo Refill on albuterol to use as needed

## 2014-03-03 ENCOUNTER — Telehealth: Payer: Self-pay

## 2014-03-03 NOTE — Telephone Encounter (Addendum)
LMTCB and will forward to CJ to f/u on

## 2014-03-03 NOTE — Telephone Encounter (Signed)
PA has been denied for [employer]- insurance states that she must have tried and failed 3 alternatives: flunisolide, fluticasone, triamcinolone, or nasonex.  Dr. Delford FieldWright please advise on a medication switch, or if we need to appeal this.    Forms on how to appeal are in PW's look-at box for Crystal.

## 2014-03-03 NOTE — Telephone Encounter (Signed)
Try nasonex two puff ea nostril daily

## 2014-03-03 NOTE — Telephone Encounter (Signed)
[facility] Qnasl.  This was submitted via CoverMyMeds as this was the only listed option to initiate.  A response is expected within 24 hours.  The number to call and check the status of this is [phone]  Will hold original form in PA box in triage room and forward to Crystal to look out for status.

## 2014-03-04 MED ORDER — MOMETASONE FUROATE 50 MCG/ACT NA SUSP: 2.0000 | Freq: Every day | NASAL | Status: DC

## 2014-03-04 NOTE — Telephone Encounter (Signed)
Spoke with pt.  Discussed below regarding Qnasl and Nasonex.  She verbalized understanding of [facility] rx being [facility] to [facility] as Qnasl is not preferred with her insurance.  She is aware to call office back if she has any problems with Nasonex or symptoms worsen with this medication.  Pt in agreement with plan and voiced no further questions or concerns at this time.

## 2014-05-06 ENCOUNTER — Encounter: Payer: Self-pay | Admitting: Adult Health

## 2014-05-06 ENCOUNTER — Ambulatory Visit (INDEPENDENT_AMBULATORY_CARE_PROVIDER_SITE_OTHER)
Admission: RE | Admit: 2014-05-06 | Discharge: 2014-05-06 | Disposition: A | Discharge status: Home / Self Care | Payer: BLUE CROSS/BLUE SHIELD | Source: Ambulatory Visit | Attending: Adult Health | Admitting: Adult Health

## 2014-05-06 ENCOUNTER — Telehealth: Payer: Self-pay | Admitting: Critical Care Medicine

## 2014-05-06 ENCOUNTER — Ambulatory Visit (INDEPENDENT_AMBULATORY_CARE_PROVIDER_SITE_OTHER): Payer: BLUE CROSS/BLUE SHIELD | Admitting: Adult Health

## 2014-05-06 VITALS — BP 120/68 | HR 76 | Temp 98.1°F | Ht 63.0 in | Wt 117.6 lb

## 2014-05-06 DIAGNOSIS — J209 Acute bronchitis, unspecified: Secondary | ICD-10-CM

## 2014-05-06 DIAGNOSIS — J4541 Moderate persistent asthma with (acute) exacerbation: Secondary | ICD-10-CM | POA: Diagnosis not present

## 2014-05-06 MED ORDER — PREDNISONE 10 MG PO TABS: ORAL_TABLET | ORAL | Status: DC

## 2014-05-06 MED ORDER — LEVALBUTEROL HCL 0.63 MG/3ML IN NEBU
0.6300 mg | INHALATION_SOLUTION | Freq: Once | RESPIRATORY_TRACT | Status: AC
  Administered 2014-05-06: 0.63 mg via RESPIRATORY_TRACT

## 2014-05-06 MED ORDER — AMOXICILLIN-POT CLAVULANATE 875-125 MG PO TABS: 1.0000 | ORAL_TABLET | Freq: Two times a day (BID) | ORAL | Status: AC

## 2014-05-06 NOTE — Assessment & Plan Note (Signed)
Exacerbation with probable bronchitis /URI with chronic cough exacerbation  Check CXR  Some concern over recent travel to [place] however hx/exam points more to asthma than other etiology  Have advised her that if symptoms do not improve or develop wt loss, hemoptysis , night sweats to contact our office immediately.  xopenex  neb x 1   Plan  Augmentin 875mg  Twice daily  For 7 days  Mucinex DM Twice daily  As needed  Cough/congestion .  Prednisone taper over next week.  Chest xray today  Please contact office for sooner follow up if symptoms do not improve or worsen or seek emergency care  Follow up Dr. Delford FieldWright  In 4 weeks and As needed

## 2014-05-06 NOTE — Addendum Note (Signed)
Addended by: Nicanor AlconNAGLE, John Williamsen K on: 05/06/2014 01:13 PM   Modules accepted: Orders

## 2014-05-06 NOTE — Progress Notes (Addendum)
   Subjective:    Patient ID: Sara Trevino, female    DOB: 1965/02/18, 49 y.o.   MRN: [id number]  HPI  49 yo with cough chronic and allergies  Followed by allergy MD, pt is on allergy immunotherapy   05/06/2014 Acute OV  Patient presents for an acute office visit She complains of prod  cough with yellow and white mucus and increase in chest congestion x 1 month. Pt denies increase in SOB from baseline and denies CP/tightness. Over the last week. Cough has worsened with intermittent wheezing. She has just returned from [place] after a five-week trip for work. Patient denies any fever, night sweats, hemoptysis, chest pain, orthopnea, PND, calf pain, or leg swelling. No weight loss.  She denies any rash, joint swelling , or nausea, vomiting, diarrhea. She has not used any medications for cough. She remains on Asmanex, Nasonex, Allegra.   Review of Systems Constitutional:   No  weight loss, night sweats,  Fevers, chills, fatigue, or  lassitude.  HEENT:   No headaches,  Difficulty swallowing,  Tooth/dental problems, or  Sore throat,                No sneezing, itching, ear ache, + nasal congestion, post nasal drip,   CV:  No chest pain,  Orthopnea, PND, swelling in lower extremities, anasarca, dizziness, palpitations, syncope.   GI  No heartburn, indigestion, abdominal pain, nausea, vomiting, diarrhea, change in bowel habits, loss of appetite, bloody stools.   Resp: No chest wall deformity  Skin: no rash or lesions.  GU: no dysuria, change in color of urine, no urgency or frequency.  No flank pain, no hematuria   MS:  No joint pain or swelling.  No decreased range of motion.  No back pain.  Psych:  No change in mood or affect. No depression or anxiety.  No memory loss.         Objective:   Physical Exam GEN: A/Ox3; pleasant , NAD, well nourished . Dry cough   HEENT:  [place]/AT,  EACs-clear, TMs-wnl, NOSE-clear, THROAT-clear, no lesions, no postnasal drip or exudate noted.    NECK:  Supple w/ fair ROM; no JVD; normal carotid impulses w/o bruits; no thyromegaly or nodules palpated; no lymphadenopathy.  RESP  Trace exp wheezes/ rales/ or rhonchi.no accessory muscle use, no dullness to percussion Talks in full sentenences   CARD:  RRR, no m/r/g  , no peripheral edema, pulses intact, no cyanosis or clubbing.  GI:   Soft & nt; nml bowel sounds; no organomegaly or masses detected.  Musco: Warm bil, no deformities or joint swelling noted.   Neuro: alert, no focal deficits noted.    Skin: Warm, no lesions or rashes         Assessment & Plan:

## 2014-05-06 NOTE — Telephone Encounter (Signed)
Pt has been scheduled to see TP today at 12pm. Nothing further was needed.

## 2014-05-06 NOTE — Patient Instructions (Signed)
Augmentin 875mg  Twice daily  For 7 days  Mucinex DM Twice daily  As needed  Cough/congestion .  Prednisone taper over next week.  Chest xray today  Please contact office for sooner follow up if symptoms do not improve or worsen or seek emergency care  Follow up Dr. Delford FieldWright  In 4 weeks and As needed

## 2014-05-08 NOTE — Progress Notes (Signed)
Quick Note:  LMOM TCB x1. ______ 

## 2014-05-11 ENCOUNTER — Telehealth: Payer: Self-pay | Admitting: Critical Care Medicine

## 2014-05-11 NOTE — Telephone Encounter (Signed)
PT informed of cxr results per Tammy Parrett.

## 2014-06-08 ENCOUNTER — Encounter: Payer: Self-pay | Admitting: Critical Care Medicine

## 2014-06-08 ENCOUNTER — Telehealth: Payer: Self-pay | Admitting: *Deleted

## 2014-06-08 ENCOUNTER — Ambulatory Visit (INDEPENDENT_AMBULATORY_CARE_PROVIDER_SITE_OTHER): Payer: BLUE CROSS/BLUE SHIELD | Admitting: Critical Care Medicine

## 2014-06-08 ENCOUNTER — Other Ambulatory Visit: Payer: BLUE CROSS/BLUE SHIELD

## 2014-06-08 VITALS — BP 108/68 | HR 80 | Temp 100.0°F | Ht 62.0 in | Wt 115.4 lb

## 2014-06-08 DIAGNOSIS — J0191 Acute recurrent sinusitis, unspecified: Secondary | ICD-10-CM

## 2014-06-08 DIAGNOSIS — J4541 Moderate persistent asthma with (acute) exacerbation: Secondary | ICD-10-CM | POA: Diagnosis not present

## 2014-06-08 MED ORDER — CLARITHROMYCIN 500 MG PO TABS: 500.0000 mg | ORAL_TABLET | Freq: Two times a day (BID) | ORAL | Status: DC

## 2014-06-08 MED ORDER — BUDESONIDE-FORMOTEROL FUMARATE 160-4.5 MCG/ACT IN AERO: 2.0000 | INHALATION_SPRAY | Freq: Two times a day (BID) | RESPIRATORY_TRACT | Status: DC

## 2014-06-08 MED ORDER — PREDNISONE 10 MG PO TABS: ORAL_TABLET | ORAL | Status: DC

## 2014-06-08 NOTE — Telephone Encounter (Signed)
PA request came thru cover my meds for  Symbicort. Key: [id number]. PA has been submitted.

## 2014-06-08 NOTE — Patient Instructions (Signed)
Prednisone 10mg  Take 4 for three days 3 for three days 2 for three days 1 for three days and stop Biaxin one twice daily Take probiotic daily while on biaxin Use delsym over the counter for cough Stop asmanex Stop breo Start Symbicort two puff twice daily Labs today IgM,G,A Return 6 weeks recheck tammy parrett

## 2014-06-08 NOTE — Progress Notes (Signed)
Subjective:    Patient ID: Sara Trevino, female    DOB: [birth year], 49 y.o.   MRN: [id number]  HPI 06/08/2014 Chief Complaint  Patient presents with  . 4 wk follow up    Completed prednisone. Stopped augmentin after 2 days d/t diarrhea.  Still coughing - worsened x 4-5 days ago with increased SOB, chest tightness, and HA.  Cough prod with yellow mucus.  No fever.    Pt had diarrhea with augmentin, only took two days of ABX, finished pred.  Cough is prod yellow to white, notes some sinus pressure.  No sore throat. No fever, flushed was noted.  Noted more dyspnea, notes some wheeze,  Using rescue inhaler more.. Pt saw company MD today.  Pt denies any significant sore throat, nasal congestion or excess secretions, fever, chills, sweats, unintended weight loss, pleurtic or exertional chest pain, orthopnea PND, or leg swelling Pt denies any increase in rescue therapy over baseline, denies waking up needing it or having any early am or nocturnal exacerbations of coughing/wheezing/or dyspnea. Pt also denies any obvious fluctuation in symptoms with  weather or environmental change or other alleviating or aggravating factors   Current Medications, Allergies, Complete Past Medical History, Past Surgical History, Family History, and Social History were reviewed in [facility] electronic medical record.  Past Medical History  Diagnosis Date  . Cough   . SOB (shortness of breath)      Family History  Problem Relation Age of Onset  . Positive PPD/TB Exposure Mother      History   Social History  . Marital Status: Married    Spouse Name: N/A  . Number of Children: 1  . 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 Smoking status: Never Smoker   . Smokeless tobacco: Never Used  . Alcohol Use: No  . Drug Use: No  . Sexual Activity: Not on file   Other Topics Concern  . Not on file   Social History Narrative     No  Known Allergies   Outpatient Prescriptions Prior to Visit  Medication Sig Dispense Refill  . albuterol (VENTOLIN HFA) 108 (90 BASE) MCG/ACT inhaler Inhale 2 puffs into the lungs every 6 (six) hours as needed. For wheezing and shortness of breath. 18 g 4  . fexofenadine (ALLEGRA) 180 MG tablet Take 180 mg by mouth daily as needed.     . mometasone (ASMANEX 120 METERED DOSES) 220 MCG/INH inhaler Inhale 2 puffs into the lungs daily. 1 Inhaler 12  . mometasone (NASONEX) 50 MCG/ACT nasal spray Place 2 sprays into the nose daily. 17 g 6  . Multiple Vitamin (MULTIVITAMIN) tablet Take 1 tablet by mouth daily.    . Omega-3 Fatty Acids (FISH OIL) 600 MG CAPS Take 1 capsule by mouth daily.    Marland Kitchen. PRESCRIPTION MEDICATION Allergy injections once weekly    . predniSONE (DELTASONE) 10 MG tablet 4 tabs for 2 days, then 3 tabs for 2 days, 2 tabs for 2 days, then 1 tab for 2 days, then stop (Patient not taking: Reported on 06/08/2014) 20 tablet 0   No facility-administered medications prior to visit.    Review of Systems  Constitutional: Negative for fever, chills, diaphoresis, appetite change, fatigue and unexpected weight change.  HENT: Positive for postnasal drip, rhinorrhea, sinus pressure and sore throat. Negative for congestion, ear discharge, ear pain, hearing loss, nosebleeds, sneezing, trouble swallowing and voice  change.   Eyes: Negative for discharge and itching.  Respiratory: Positive for cough, shortness of breath and wheezing. Negative for apnea, choking and stridor.   Cardiovascular: Positive for chest pain. Negative for palpitations and leg swelling.  Gastrointestinal: Negative.  Negative for nausea, vomiting, abdominal pain and abdominal distention.  Endocrine: Negative.   Genitourinary: Negative.   Musculoskeletal: Negative for myalgias, joint swelling and arthralgias.  Skin: Negative for rash.  Allergic/Immunologic: Negative for environmental allergies.  Neurological: Negative for  dizziness, syncope, weakness and headaches.  Hematological: Negative for adenopathy. Does not bruise/bleed easily.  Psychiatric/Behavioral: Negative for sleep disturbance and agitation. The patient is not nervous/anxious.        Objective:   Physical Exam  Constitutional: She is oriented to person, place, and time. She appears well-developed and well-nourished. She is active.  HENT:  Head: Normocephalic and atraumatic.  Nose: Mucosal edema, rhinorrhea and sinus tenderness present. No nasal deformity or septal deviation. No epistaxis. Right sinus exhibits no maxillary sinus tenderness and no frontal sinus tenderness. Left sinus exhibits no maxillary sinus tenderness and no frontal sinus tenderness.  Mouth/Throat: Oropharynx is clear and moist. No oropharyngeal exudate.  Nasal purulence L >R nare  Eyes: Conjunctivae and EOM are normal. Pupils are equal, round, and reactive to light. No scleral icterus.  Neck: Trachea normal and normal range of motion. Neck supple. No JVD present. No tracheal tenderness and no muscular tenderness present. Carotid bruit is not present. No rigidity. No tracheal deviation, no edema, no erythema and normal range of motion present. No thyromegaly present.  Cardiovascular: Normal rate, regular rhythm, S1 normal, S2 normal, normal heart sounds, intact distal pulses and normal pulses.  PMI is not displaced.  Exam reveals no gallop, no S3, no S4, no distant heart sounds and no friction rub.   No murmur heard.  No systolic murmur is present   No diastolic murmur is present  Pulmonary/Chest: No accessory muscle usage or stridor. No apnea and no tachypnea. No respiratory distress. She has decreased breath sounds. She has wheezes in the right middle field, the right lower field, the left middle field and the left lower field. She has no rhonchi. She has no rales. Chest wall is not dull to percussion. She exhibits no mass, no tenderness, no bony tenderness and no deformity.    Abdominal: Soft. Normal appearance and bowel sounds are normal. She exhibits no distension and no ascites. There is no hepatosplenomegaly. There is no tenderness. There is no rigidity, no rebound and no guarding.  Musculoskeletal: Normal range of motion.  Lymphadenopathy:       Head (right side): No submental and no submandibular adenopathy present.       Head (left side): No submental and no submandibular adenopathy present.    She has no cervical adenopathy.  Neurological: She is alert and oriented to person, place, and time. She has normal strength. No sensory deficit.  Skin: Skin is warm and dry. No rash noted. She is not diaphoretic. No pallor. Nails show no clubbing.  Psychiatric: She has a normal mood and affect. Her speech is normal and behavior is normal.  Vitals reviewed.         Assessment & Plan:  I personally reviewed all images and lab data in the [facility] system as well as any outside material available during this office visit and agree with the  radiology impressions.  I also have reviewed any data /notes/records if available in care everywhere.  Moderate persistent asthma with exacerbation  Moderate persistent asthma with recurrent exacerbation and recurrent sinusitis Significant atopic features Elevated IgE levels Need to evaluate for immunodeficiency state Plan Prednisone  Take 4 for three days 3 for three days 2 for three days 1 for three days and stop Biaxin one twice daily Take probiotic daily while on biaxin Stop asmanex Stop breo Start Symbicort two puff twice daily Labs today IgM,G,A Return 6 weeks     Kelee was seen today for 4 wk follow up.  Diagnoses and all orders for this visit:  Acute recurrent sinusitis, unspecified location Orders: -     IgG, IgA, IgM; Future  Moderate persistent asthma with exacerbation  Other orders -     clarithromycin (BIAXIN) 500 MG tablet; Take 1 tablet (500 mg total) by mouth 2 (two) times daily. -      predniSONE (DELTASONE) 10 MG tablet; Take 4 for three days 3 for three days 2 for three days 1 for three days and stop -     budesonide-formoterol (SYMBICORT) 160-4.5 MCG/ACT inhaler; Inhale 2 puffs into the lungs 2 (two) times daily.    I had an extended discussion with the patient reviewing all relevant studies completed to date and  lasting 15 to 20 minutes of a 25 minute visit on the following ongoing concerns:  Diagnosis of asthma with associated treatment options and need for testing further with regards to immunodeficiency

## 2014-06-08 NOTE — Assessment & Plan Note (Signed)
Moderate persistent asthma with recurrent exacerbation and recurrent sinusitis Significant atopic features Elevated IgE levels Need to evaluate for immunodeficiency state Plan Prednisone 10mg  Take 4 for three days 3 for three days 2 for three days 1 for three days and stop Biaxin one twice daily Take probiotic daily while on biaxin Stop asmanex Stop breo Start Symbicort two puff twice daily Labs today IgM,G,A Return 6 weeks

## 2014-06-09 LAB — IGG, IGA, IGM
IgA: 237 mg/dL (ref 69–380)
IgG (Immunoglobin G), Serum: 1260 mg/dL (ref 690–1700)
IgM, Serum: 181 mg/dL (ref 52–322)

## 2014-06-09 NOTE — Progress Notes (Signed)
Quick Note:  Call pt and tell her labs are ok, No change in medications. She does not have an immune deficiency ______

## 2014-06-10 ENCOUNTER — Ambulatory Visit: Payer: BLUE CROSS/BLUE SHIELD | Admitting: Critical Care Medicine

## 2014-06-10 NOTE — Progress Notes (Signed)
Quick Note:  Called, spoke with pt. Discussed lab results and recs per Dr. Delford FieldWright. She verbalized understanding understanding and voiced no further questions or concerns at this time. ______

## 2014-06-18 ENCOUNTER — Ambulatory Visit: Payer: BLUE CROSS/BLUE SHIELD | Admitting: Critical Care Medicine

## 2014-06-22 ENCOUNTER — Telehealth: Payer: Self-pay | Admitting: Critical Care Medicine

## 2014-06-22 NOTE — Telephone Encounter (Signed)
Can change to ADVair 250 one puff twice daily

## 2014-06-22 NOTE — Telephone Encounter (Signed)
Called spoke with pt. She reports symbicort is not covered. She received a letter stating advair and ventolin are covered. Please advise Dr. Delford FieldWright thanks

## 2014-06-22 NOTE — Telephone Encounter (Signed)
lmtcb

## 2014-06-23 MED ORDER — FLUTICASONE-SALMETEROL 250-50 MCG/DOSE IN AEPB: 1.0000 | INHALATION_SPRAY | Freq: Two times a day (BID) | RESPIRATORY_TRACT | Status: DC

## 2014-06-23 NOTE — Telephone Encounter (Signed)
[phone] pt calling back

## 2014-06-23 NOTE — Telephone Encounter (Signed)
See phone msg from 06/22/14.  Symbicort changed to Advair.  Will sign off.

## 2014-06-23 NOTE — Telephone Encounter (Signed)
Spoke with pt and informed her per PW that we could send Advair 250 to her pharmacy. Pt agreed. Nothing further needed.

## 2014-07-21 ENCOUNTER — Other Ambulatory Visit (INDEPENDENT_AMBULATORY_CARE_PROVIDER_SITE_OTHER): Payer: BLUE CROSS/BLUE SHIELD

## 2014-07-21 ENCOUNTER — Encounter: Payer: Self-pay | Admitting: Adult Health

## 2014-07-21 ENCOUNTER — Ambulatory Visit (INDEPENDENT_AMBULATORY_CARE_PROVIDER_SITE_OTHER): Payer: BLUE CROSS/BLUE SHIELD | Admitting: Adult Health

## 2014-07-21 VITALS — BP 108/80 | HR 73 | Temp 97.3°F | Ht 62.0 in | Wt 115.0 lb

## 2014-07-21 DIAGNOSIS — J4541 Moderate persistent asthma with (acute) exacerbation: Secondary | ICD-10-CM | POA: Diagnosis not present

## 2014-07-21 LAB — CBC WITH DIFFERENTIAL/PLATELET
BASOS ABS: 0 10*3/uL (ref 0.0–0.1)
Basophils Relative: 0.2 % (ref 0.0–3.0)
EOS ABS: 0.7 10*3/uL (ref 0.0–0.7)
Eosinophils Relative: 13.6 % — ABNORMAL HIGH (ref 0.0–5.0)
HCT: 37.9 % (ref 36.0–46.0)
Hemoglobin: 12.8 g/dL (ref 12.0–15.0)
LYMPHS PCT: 29.8 % (ref 12.0–46.0)
Lymphs Abs: 1.6 10*3/uL (ref 0.7–4.0)
MCHC: 33.7 g/dL (ref 30.0–36.0)
MCV: 91.4 fl (ref 78.0–100.0)
Monocytes Absolute: 0.3 10*3/uL (ref 0.1–1.0)
Monocytes Relative: 6.4 % (ref 3.0–12.0)
NEUTROS ABS: 2.7 10*3/uL (ref 1.4–7.7)
NEUTROS PCT: 50 % (ref 43.0–77.0)
Platelets: 174 10*3/uL (ref 150.0–400.0)
RBC: 4.15 Mil/uL (ref 3.87–5.11)
RDW: 14.7 % (ref 11.5–15.5)
WBC: 5.4 10*3/uL (ref 4.0–10.5)

## 2014-07-21 MED ORDER — LEVALBUTEROL HCL 0.63 MG/3ML IN NEBU
0.6300 mg | INHALATION_SOLUTION | Freq: Once | RESPIRATORY_TRACT | Status: AC
Start: 2014-07-21 — End: 2014-07-21
  Administered 2014-07-21: 0.63 mg via RESPIRATORY_TRACT

## 2014-07-21 MED ORDER — MOMETASONE FURO-FORMOTEROL FUM 200-5 MCG/ACT IN AERO: 2.0000 | INHALATION_SPRAY | Freq: Two times a day (BID) | RESPIRATORY_TRACT | Status: DC

## 2014-07-21 NOTE — Patient Instructions (Addendum)
Stop Advair  Begin Dulera 200 2 puffs Twice daily  , rinse after use.  Begin Delsym 2 tsp Twice daily  For cough  Continue on Allegra 180mg  daily in am  Begin Chlortrimeton 4mg  2 At bedtime   NO MINTS .  Labs today .  follow up Dr. Delford FieldWright  In 3 weeks and As needed   Please contact office for sooner follow up if symptoms do not improve or worsen or seek emergency care

## 2014-07-21 NOTE — Progress Notes (Signed)
   Subjective:    Patient ID: Sara Trevino, female    DOB: [birth year], 49 y.o.   MRN: [id number]  HPI   49  yo with cough chronic and allergies and asthma  Followed by allergy MD, pt is on allergy immunotherapy   TEST  CT chest 11/2013 clear lungs  CT sinus 12/2013 neg for acute process    07/21/2014 Acute OV  Patient presents for an acute office visit She complains of prod  cough with yellow and white mucus and increase in chest congestion x 1 month.   She has frequent flares of her asthmatic bronchitis , tx w/ several abx and steroid course. Says she feels better briefly but then symptoms return.  Says she has had chronic cough for last 4years with several different inhaler , allergy vaccines, without much improvement .  Seen 6 weeks ago, tx w/ abx and steroids .  Says recently on steroids again.  Was changed from Advair to Symbicort last ov , but insurance would not cover.  Says insurance will cover Dulera .  CT chest and sinus last year without acute  Process.  CXR in April this year clear.   Patient denies any fever, night sweats, hemoptysis, chest pain, orthopnea, PND, calf pain, or leg swelling. No weight loss.  She denies any rash, joint swelling , or nausea, vomiting, diarrhea. She has not used any medications for cough. She remains on Advair , Nasonex, Allegra. Labs last ov with IgG and IgM that were normal.  Review of Systems  Constitutional:   No  weight loss, night sweats,  Fevers, chills, fatigue, or  lassitude.  HEENT:   No headaches,  Difficulty swallowing,  Tooth/dental problems, or  Sore throat,                No sneezing, itching, ear ache, + nasal congestion, post nasal drip,   CV:  No chest pain,  Orthopnea, PND, swelling in lower extremities, anasarca, dizziness, palpitations, syncope.   GI  No heartburn, indigestion, abdominal pain, nausea, vomiting, diarrhea, change in bowel habits, loss of appetite, bloody stools.   Resp: No chest wall  deformity  Skin: no rash or lesions.  GU: no dysuria, change in color of urine, no urgency or frequency.  No flank pain, no hematuria   MS:  No joint pain or swelling.  No decreased range of motion.  No back pain.  Psych:  No change in mood or affect. No depression or anxiety.  No memory loss.         Objective:   Physical Exam  GEN: A/Ox3; pleasant , NAD, well nourished . Dry cough   HEENT:  [place]/AT,  EACs-clear, TMs-wnl, NOSE-clear, THROAT-clear, no lesions, no postnasal drip or exudate noted.   NECK:  Supple w/ fair ROM; no JVD; normal carotid impulses w/o bruits; no thyromegaly or nodules palpated; no lymphadenopathy.  RESP  Trace exp wheezes w/ no  rales/ or rhonchi.no accessory muscle use, no dullness to percussion Talks in full sentenences   CARD:  RRR, no m/r/g  , no peripheral edema, pulses intact, no cyanosis or clubbing.  GI:   Soft & nt; nml bowel sounds; no organomegaly or masses detected.  Musco: Warm bil, no deformities or joint swelling noted.   Neuro: alert, no focal deficits noted.    Skin: Warm, no lesions or rashes         Assessment & Plan:

## 2014-07-21 NOTE — Assessment & Plan Note (Signed)
Recurrent asthmatic exacerbations requiring anabiotic and steroid-induced Immune panel was normal. Last visit Chest x-ray in April showed no acute process. Patient is been tried on several different inhalers She suffers from a chronic cyclical cough which may be aggravated by Advair We'll [facility] which is [facility] from [facility] insurance Repeat IgE and rast test today as previously with elevated IgE level Check a CBC with differential looking for eosinophil elevation If elevated, consider if Xolair candidate. Patient is on immunotherapy through Dr. [employer] CallasSharma  Plan  Stop Advair  Begin Dulera 200 2 puffs Twice daily  , rinse after use.  Begin Delsym 2 tsp Twice daily  For cough  Continue on Allegra 180mg  daily in am  Begin Chlortrimeton 4mg  2 At bedtime   NO MINTS .  Labs today .  follow up Dr. Delford FieldWright  In 3 weeks and As needed   Please contact office for sooner follow up if symptoms do not improve or worsen or seek emergency care

## 2014-07-22 LAB — ALLERGY FULL PROFILE
ALLERGEN, D PTERNOYSSINUS, D1: 11.7 kU/L — AB
Allergen,Goose feathers, e70: <0.1 kU/L
Alternaria Alternata: <0.1 kU/L
Aspergillus fumigatus, m3: <0.1 kU/L
Bahia Grass: <0.1 kU/L
Bermuda Grass: <0.1 kU/L
Box Elder IgE: <0.1 kU/L
Cat Dander: <0.1 kU/L
Common Ragweed: <0.1 kU/L
Curvularia lunata: <0.1 kU/L
D. FARINAE: 16.4 kU/L — AB
ELM IGE: 0.15 kU/L — AB
Fescue: 0.24 kU/L — ABNORMAL HIGH
G005 RYE, PERENNIAL: 0.33 kU/L — AB
G009 RED TOP: 0.39 kU/L — AB
Goldenrod: <0.1 kU/L
Helminthosporium halodes: <0.1 kU/L
House Dust Hollister: 0.63 kU/L — ABNORMAL HIGH
IgE (Immunoglobulin E), Serum: 249 kU/L — ABNORMAL HIGH (ref <115)
Lamb's Quarters: <0.1 kU/L
OAK CLASS: 2.3 kU/L — AB
Plantain: <0.1 kU/L
Stemphylium Botryosum: <0.1 kU/L
Sycamore Tree: <0.1 kU/L
Timothy Grass: 0.16 kU/L — ABNORMAL HIGH

## 2014-07-24 NOTE — Progress Notes (Signed)
Quick Note:  LVM for patient to return call. ______ 

## 2014-07-27 ENCOUNTER — Telehealth: Payer: Self-pay | Admitting: Critical Care Medicine

## 2014-07-27 NOTE — Telephone Encounter (Signed)
Result Note     Allergy profile very high     IgE is high     Allergy component in blood is high, eosinophils.     Would like her to see Dr. Maple HudsonYoung Allergist to consider if candidate for Nucala -injectable   --  Called pt and LMTCB x1

## 2014-07-28 NOTE — Telephone Encounter (Signed)
Pt is aware. Will forward back to [facility]

## 2014-07-28 NOTE — Telephone Encounter (Signed)
Called and spoke to pt. Informed her of the results and recs per PW. CY does not have any openings until November.   CY & Katie-please advise if this pt can be worked in sooner. THanks.

## 2014-07-28 NOTE — Telephone Encounter (Signed)
Please let patient know that CY is out of the office this week and I will need to speak with him as to where to place patient on schedule. Thanks.

## 2014-07-29 NOTE — Progress Notes (Signed)
Quick Note:  Called and spoke with pt. Discussed results and recs and scheduled her for an allergy consult with CY on 08/10/14 per TP's request. ______

## 2014-07-29 NOTE — Telephone Encounter (Signed)
CY Please advise on patient seeing you. How soon would you like me to book her? Thanks.

## 2014-07-30 MED ORDER — MOMETASONE FURO-FORMOTEROL FUM 200-5 MCG/ACT IN AERO: 2.0000 | INHALATION_SPRAY | Freq: Two times a day (BID) | RESPIRATORY_TRACT | Status: DC

## 2014-07-30 NOTE — Telephone Encounter (Signed)
Spoke with pt and appt scheduled for 7/29. Nothing further needed

## 2014-07-30 NOTE — Telephone Encounter (Signed)
As discussed- fit in where able with held slots

## 2014-07-30 NOTE — Telephone Encounter (Signed)
We can have patient come for allergy consult on Friday 08-14-14 at 11:15am slot. Thanks.

## 2014-08-10 ENCOUNTER — Ambulatory Visit (INDEPENDENT_AMBULATORY_CARE_PROVIDER_SITE_OTHER): Payer: BLUE CROSS/BLUE SHIELD | Admitting: Internal Medicine

## 2014-08-10 ENCOUNTER — Encounter: Payer: Self-pay | Admitting: Internal Medicine

## 2014-08-10 ENCOUNTER — Other Ambulatory Visit: Payer: BLUE CROSS/BLUE SHIELD

## 2014-08-10 VITALS — BP 104/72 | HR 75 | Ht 65.0 in | Wt 116.0 lb

## 2014-08-10 DIAGNOSIS — J309 Allergic rhinitis, unspecified: Secondary | ICD-10-CM

## 2014-08-10 DIAGNOSIS — J302 Other seasonal allergic rhinitis: Secondary | ICD-10-CM

## 2014-08-10 DIAGNOSIS — J4541 Moderate persistent asthma with (acute) exacerbation: Secondary | ICD-10-CM

## 2014-08-10 DIAGNOSIS — J3089 Other allergic rhinitis: Secondary | ICD-10-CM

## 2014-08-10 NOTE — Progress Notes (Signed)
Subjective:    Patient ID: Sara Trevino, female    DOB: 03/24/1965, 49 y.o.   MRN: [id number]  HPI  50  yo with cough chronic and allergies and asthma  Followed by allergy MD, pt is on allergy immunotherapy   TEST  CT chest 11/2013 clear lungs  CT sinus 12/2013 neg for acute process    07/21/14- TP- Acute OV  Patient presents for an acute office visit She complains of prod  cough with yellow and white mucus and increase in chest congestion x 1 month.   She has frequent flares of her asthmatic bronchitis , tx w/ several abx and steroid course. Says she feels better briefly but then symptoms return.  Says she has had chronic cough for last 4years with several different inhaler , allergy vaccines, without much improvement .  Seen 6 weeks ago, tx w/ abx and steroids .  Says recently on steroids again.  Was changed from Advair to Symbicort last ov , but insurance would not cover.  Says insurance will cover Dulera .  CT chest and sinus last year without acute  Process.  CXR in April this year clear.   08/10/14- 75 yo F never smoker referred courtesy of Dr Delford Field for allergy evaluation to consider Nucala therapy for moderate persistent asthma FOLLOWS ION:[id number] for Nucala inj.,  constant cough x 25yrs,  Allergy profile 07/21/14- Total IgE 249  Elevated for dust mite. Grass pollens, tree pollens CBC w diff 07/21/14- EOS  13.6 H Allergy skin test 04/21/11 elevated dog, weed pollens, tree pollens molds She complains primarily of coughing 4 years. Occasional white or yellow sputum. Nasal congestion itching and sneezing                 ? 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 Does not recognize association between symptoms and workplace. She was on allergy vaccine for 3-1/2 years/Dr. [place] Callas, without clear benefit. Says both Dulera and Symbicort caused itching in throat. She expresses concern that she might have cancer in her throat because of  this. Notices facial flushing in the evenings 4 years, before bedtime. No exposure trigger recognized. She is anxious about reported side effects of Xolair from her readings-discussed Has an appointment pending for pulmonary second opinion with Dr Susa Simmonds at [facility].  Prior to Admission medications   Medication Sig Start Date End Date Taking? Authorizing Provider  albuterol (VENTOLIN HFA) 108 (90 BASE) MCG/ACT inhaler Inhale 2 puffs into the lungs every 6 (six) hours as needed. For wheezing and shortness of breath. 02/23/14  Yes Storm Frisk, MD  fexofenadine (ALLEGRA) 180 MG tablet Take 180 mg by mouth daily as needed.    Yes Historical Provider, MD  mometasone (NASONEX) 50 MCG/ACT nasal spray Place 2 sprays into the nose daily. 03/04/14  Yes Storm Frisk, MD  Multiple Vitamin (MULTIVITAMIN) tablet Take 1 tablet by mouth daily.   Yes Historical Provider, MD  PRESCRIPTION MEDICATION Allergy injections once weekly   Yes Historical Provider, MD  mometasone-formoterol (DULERA) 200-5 MCG/ACT AERO Inhale 2 puffs into the lungs 2 (two) times daily. 07/30/14 07/31/14  Julio Sicks, NP   Past Medical History  Diagnosis Date  . Cough   . SOB (shortness of breath)    No past surgical history on file. Family History  Problem Relation Age of Onset  . Positive PPD/TB Exposure Mother    History   Social History  . Marital Status: Married  Spouse Name: N/A  . Number of Children: 1  . 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 Smoking status: Never Smoker   . Smokeless tobacco: Never Used  . Alcohol Use: No  . Drug Use: No  . Sexual Activity: Not on file   Other Topics Concern  . Not on file   Social History Narrative    Review of Systems  ROS-see HPI   Negative unless "+" Constitutional:    weight loss, night sweats, fevers, chills, fatigue, lassitude. HEENT:    headaches, difficulty swallowing, tooth/dental  problems, +sore throat,       +sneezing, +itching, ear ache, +nasal congestion, post nasal drip, snoring CV:    chest pain, orthopnea, PND, swelling in lower extremities, anasarca,                                                     dizziness, palpitations Resp:   +shortness of breath with exertion or at rest.                +productive cough,   +non-productive cough, coughing up of blood.              change in color of mucus.  wheezing.   Skin:    rash or lesions. GI:  No-   heartburn, indigestion, abdominal pain, nausea, vomiting, diarrhea,                 change in bowel habits, loss of appetite GU: dysuria, change in color of urine, no urgency or frequency.   flank pain. MS:   joint pain, +stiffness, decreased range of motion, back pain. Neuro-     nothing unusual Psych:  change in mood or affect.  depression or anxiety.   memory loss.    Objective:   Physical Exam  OBJ- Physical Exam General- Alert, Oriented, Affect-appropriate, Distress- none acute, + slender Skin- rash-none, lesions- none, excoriation- none Lymphadenopathy- none Head- atraumatic            Eyes- Gross vision intact, PERRLA, conjunctivae and secretions clear            Ears- Hearing, canals-normal            Nose- Clear, no-Septal dev, mucus, polyps, erosion, perforation             Throat- Mallampati II , mucosa clear , drainage- none, tonsils- atrophic Neck- flexible , trachea midline, no stridor , thyroid nl, carotid no bruit Chest - symmetrical excursion , unlabored           Heart/CV- RRR , no murmur , no gallop  , no rub, nl s1 s2                           - JVD- none , edema- none, stasis changes- none, varices- none           Lung- clear to P&A, wheeze + mild, cough + dry , dullness-none, rub- none           Chest wall-  Abd-  Br/ Gen/ Rectal- Not done, not indicated Extrem- cyanosis- none, clubbing, none, atrophy- none, strength- nl Neuro- grossly intact to observatio  Assessment & Plan:

## 2014-08-10 NOTE — Patient Instructions (Addendum)
Patient information on Nucala therapy for allergic asthma    If you would like to try this, then we can give it here, or you can talk to Dr Susa Simmonds at [facility] about it.   Order- sputum cultures- routine, fungal and AFB   Dx moderate persistent asthma  Sample Incruse Ellipta inhaler      1 puff, one time daily   See if this helps the cough and wheeze without causing                  the itching in your throat  Please call if we can help

## 2014-08-13 LAB — RESPIRATORY CULTURE OR RESPIRATORY AND SPUTUM CULTURE: Organism ID, Bacteria: NORMAL

## 2014-08-14 ENCOUNTER — Institutional Professional Consult (permissible substitution): Payer: BLUE CROSS/BLUE SHIELD | Admitting: Internal Medicine

## 2014-08-17 ENCOUNTER — Ambulatory Visit: Payer: BLUE CROSS/BLUE SHIELD | Admitting: Critical Care Medicine

## 2014-08-25 DIAGNOSIS — J302 Other seasonal allergic rhinitis: Secondary | ICD-10-CM | POA: Insufficient documentation

## 2014-08-25 DIAGNOSIS — J3089 Other allergic rhinitis: Secondary | ICD-10-CM

## 2014-08-25 NOTE — Assessment & Plan Note (Signed)
Symptoms would be consistent with a seasonal and perennial allergic asthmatic bronchitis and rhinitis. She expresses anxiety about Xolair based on her reading. We discussed Nucala as an option based on referral question. Plan-sputum culture, patient information packet on Nucala with discussion, sample Incruse for therapeutic trial, keep appointment for second pulmonary opinion at [facility]/Dr. Susa Simmonds

## 2014-08-25 NOTE — Assessment & Plan Note (Signed)
This does not seem is important to her, but does seem to track along with variation in her cough and is probably part of the same airway syndrome. Nucala might be helpful for both.

## 2014-09-07 LAB — FUNGUS CULTURE W SMEAR: SMEAR RESULT: NONE SEEN

## 2014-09-13 ENCOUNTER — Other Ambulatory Visit: Payer: Self-pay | Admitting: Adult Health

## 2014-09-22 LAB — AFB CULTURE WITH SMEAR (NOT AT ARMC): ACID FAST SMEAR: NONE SEEN

## 2014-09-24 NOTE — Progress Notes (Signed)
Quick Note:  Called and spoke with pt. Reviewed results and recs. Pt voiced understanding had no further questions. ______ 

## 2014-11-05 ENCOUNTER — Other Ambulatory Visit (INDEPENDENT_AMBULATORY_CARE_PROVIDER_SITE_OTHER): Payer: Self-pay | Admitting: Otolaryngology

## 2014-11-05 DIAGNOSIS — J0101 Acute recurrent maxillary sinusitis: Secondary | ICD-10-CM

## 2014-11-12 ENCOUNTER — Ambulatory Visit
Admission: RE | Admit: 2014-11-12 | Discharge: 2014-11-12 | Disposition: A | Discharge status: Home / Self Care | Payer: BLUE CROSS/BLUE SHIELD | Source: Ambulatory Visit | Attending: Otolaryngology | Admitting: Otolaryngology

## 2014-11-12 DIAGNOSIS — J0101 Acute recurrent maxillary sinusitis: Secondary | ICD-10-CM

## 2014-12-03 ENCOUNTER — Telehealth: Payer: Self-pay | Admitting: Internal Medicine

## 2014-12-03 NOTE — Telephone Encounter (Signed)
LMTCB and will hold in triage to be called again 12/04/14 am

## 2014-12-03 NOTE — Telephone Encounter (Signed)
She could have my 11:30 held spot Friday /tomorrow morning if it is still open.

## 2014-12-03 NOTE — Telephone Encounter (Signed)
Called and spoke with pt Pt states worsening cough over the past month with thick yellow mucus and increased SOB Pt denies fever, wheezing, chest tightness Pt would like appt with CY to discuss Did offer appt tomorrow afternoon with TP, but pt declined stating that she wanted to see CY instead Informed pt that message would be sent to CY to see about rec  No Known Allergies   Current Outpatient Prescriptions on File Prior to Visit  Medication Sig Dispense Refill  . albuterol (VENTOLIN HFA) 108 (90 BASE) MCG/ACT inhaler Inhale 2 puffs into the lungs every 6 (six) hours as needed. For wheezing and shortness of breath. 18 g 4  . DULERA 200-5 MCG/ACT AERO INHALE 2 PUFFS INTO THE LUNGS 2 (TWO) TIMES DAILY. 13 g 3  . fexofenadine (ALLEGRA) 180 MG tablet Take 180 mg by mouth daily as needed.     . mometasone (NASONEX) 50 MCG/ACT nasal spray Place 2 sprays into the nose daily. 17 g 6  . Multiple Vitamin (MULTIVITAMIN) tablet Take 1 tablet by mouth daily.    Marland Kitchen. PRESCRIPTION MEDICATION Allergy injections once weekly     No current facility-administered medications on file prior to visit.   Dr Domingo DimesYoumg, please advise. Thanks

## 2014-12-04 ENCOUNTER — Ambulatory Visit (INDEPENDENT_AMBULATORY_CARE_PROVIDER_SITE_OTHER): Payer: BLUE CROSS/BLUE SHIELD | Admitting: Internal Medicine

## 2014-12-04 ENCOUNTER — Encounter: Payer: Self-pay | Admitting: Internal Medicine

## 2014-12-04 VITALS — BP 138/60 | HR 98 | Ht 62.0 in | Wt 115.8 lb

## 2014-12-04 DIAGNOSIS — J4541 Moderate persistent asthma with (acute) exacerbation: Secondary | ICD-10-CM | POA: Diagnosis not present

## 2014-12-04 DIAGNOSIS — J302 Other seasonal allergic rhinitis: Secondary | ICD-10-CM

## 2014-12-04 DIAGNOSIS — J3089 Other allergic rhinitis: Secondary | ICD-10-CM

## 2014-12-04 DIAGNOSIS — J309 Allergic rhinitis, unspecified: Secondary | ICD-10-CM | POA: Diagnosis not present

## 2014-12-04 MED ORDER — AZELASTINE-FLUTICASONE 137-50 MCG/ACT NA SUSP: NASAL | Status: DC

## 2014-12-04 MED ORDER — MOMETASONE FURO-FORMOTEROL FUM 200-5 MCG/ACT IN AERO: INHALATION_SPRAY | RESPIRATORY_TRACT | Status: DC

## 2014-12-04 MED ORDER — LEVALBUTEROL HCL 0.63 MG/3ML IN NEBU
0.6300 mg | INHALATION_SOLUTION | Freq: Once | RESPIRATORY_TRACT | Status: AC
  Administered 2014-12-04: 0.63 mg via RESPIRATORY_TRACT

## 2014-12-04 MED ORDER — PREDNISONE 20 MG PO TABS: ORAL_TABLET | ORAL | Status: DC

## 2014-12-04 MED ORDER — AZITHROMYCIN 250 MG PO TABS: ORAL_TABLET | ORAL | Status: DC

## 2014-12-04 NOTE — Assessment & Plan Note (Signed)
She complains of cough but I think this is a manifestation of her asthma. She would be an appropriate candidate for Nucala if she ever agrees to try it, based on her eosinophilia. [facility], neb treatments Xopenex today, Z-Pak

## 2014-12-04 NOTE — Progress Notes (Signed)
Subjective:    Patient ID: Sara Trevino, female    DOB: 02-Oct-1965, 49 y.o.   MRN: [id number]  HPI  49  yo with cough chronic and allergies and asthma  Followed by allergy MD, pt is on allergy immunotherapy   TEST  CT chest 11/2013 clear lungs  CT sinus 12/2013 neg for acute process    07/21/14- TP- Acute OV  Patient presents for an acute office visit She complains of prod  cough with yellow and white mucus and increase in chest congestion x 1 month.   She has frequent flares of her asthmatic bronchitis , tx w/ several abx and steroid course. Says she feels better briefly but then symptoms return.  Says she has had chronic cough for last 4years with several different inhaler , allergy vaccines, without much improvement .  Seen 6 weeks ago, tx w/ abx and steroids .  Says recently on steroids again.  Was changed from Advair to Symbicort last ov , but insurance would not cover.  Says insurance will cover Dulera .  CT chest and sinus last year without acute  Process.  CXR in April this year clear.   08/10/14- 50 yo F never smoker referred courtesy of Dr Delford Field for allergy evaluation to consider Nucala therapy for moderate persistent asthma FOLLOWS WUJ:[id number] for Nucala inj.,  constant cough x 42yrs,  Allergy profile 07/21/14- Total IgE 249  Elevated for dust mite. Grass pollens, tree pollens CBC w diff 07/21/14- EOS  13.6 H Allergy skin test 04/21/11 elevated dog, weed pollens, tree pollens molds She complains primarily of coughing 4 years. Occasional white or yellow sputum. Nasal congestion itching and sneezing                 ? 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 Does not recognize association between symptoms and workplace. She was on allergy vaccine for 3-1/2 years/Dr. [place] Callas, without clear benefit. Says both Dulera and Symbicort caused itching in throat. She expresses concern that she might have cancer in her throat because of  this. Notices facial flushing in the evenings 4 years, before bedtime. No exposure trigger recognized. She is anxious about reported side effects of Xolair from her readings-discussed Has an appointment pending for pulmonary second opinion with Dr Susa Simmonds at [facility].  12/04/2014-49 year old female never smoker followed for moderate persistent asthma/cough, allergic rhinitis Acute Visit: Prod cough (yellow) for 2 weeks, increased sob, wheezing in evenings - Sweats at night - Denies fever Had seen Dr. Lyda Jester for second opinion and he also thought Nucala might be appropriate She is not interested in [place] because it is "too new". She continues allergy vaccine from Dr. [place] Callas and shows me FeNO score of 38 We'll get flu shot at work Chronic cough has increased in the last 2 weeks now with yellow brown sputum, wheeze and sweats but no definite fever  Review of Systems ROS-see HPI   Negative unless "+" Constitutional:    weight loss, night sweats, fevers, chills, fatigue, lassitude. HEENT:    headaches, difficulty swallowing, tooth/dental problems, +sore throat,       +sneezing, +itching, ear ache, +nasal congestion, post nasal drip, snoring CV:    chest pain, orthopnea, PND, swelling in lower extremities, anasarca,  dizziness, palpitations Resp:   +shortness of breath with exertion or at rest.                +productive cough,   +non-productive cough, coughing up of blood.              change in color of mucus.  + wheezing.   Skin:    rash or lesions. GI:  No-   heartburn, indigestion, abdominal pain, nausea, vomiting, diarrhea,                 change in bowel habits, loss of appetite GU: dysuria, change in color of urine, no urgency or frequency.   flank pain. MS:   joint pain, +stiffness, decreased range of motion, back pain. Neuro-     nothing unusual Psych:  change in mood or affect.  depression or anxiety.   memory loss.     Objective:   Physical Exam  OBJ- Physical Exam General- Alert, Oriented, Affect-appropriate, Distress- none acute, + slender Skin- rash-none, lesions- none, excoriation- none Lymphadenopathy- none Head- atraumatic            Eyes- Gross vision intact, PERRLA, conjunctivae and secretions clear            Ears- Hearing, canals-normal            Nose- Clear, no-Septal dev, mucus, polyps, erosion, perforation             Throat- Mallampati II , mucosa clear , drainage- none, tonsils- atrophic Neck- flexible , trachea midline, no stridor , thyroid nl, carotid no bruit Chest - symmetrical excursion , unlabored           Heart/CV- RRR , no murmur , no gallop  , no rub, nl s1 s2                           - JVD- none , edema- none, stasis changes- none, varices- none           Lung- wheeze + bilateral unlabored, cough + dry , dullness-none, rub- none           Chest wall-  Abd-  Br/ Gen/ Rectal- Not done, not indicated Extrem- cyanosis- none, clubbing, none, atrophy- none, strength- nl Neuro- grossly intact to observatio     Assessment & Plan:

## 2014-12-04 NOTE — Assessment & Plan Note (Addendum)
Continues allergy vaccine from Dr. [place] CallasSharma CT scans have shown mucosal thickening in sinuses

## 2014-12-04 NOTE — Telephone Encounter (Signed)
Patient scheduled to see Dr. Maple HudsonYoung today at 1130, ok per Dr. Maple HudsonYoung Patient notified of appointment Nothing further needed. Closing encounter

## 2014-12-04 NOTE — Telephone Encounter (Signed)
Patient Returned call  [phone]

## 2014-12-04 NOTE — Patient Instructions (Signed)
Script sent refilling Dulera maintenance inhaler to use 2 puffs, then rinse mouth, twice daily  Sample Dulera 200  Script sent for prednisone to take for a few days to get control of your asthma  Script sent for Z pak antibiotic  I suggest you get your flu shot at work in the next week or two  Please call if we can help

## 2015-01-17 HISTORY — PX: NASAL SINUS SURGERY: SHX719

## 2015-02-02 ENCOUNTER — Encounter: Payer: Self-pay | Admitting: Internal Medicine

## 2015-02-02 ENCOUNTER — Ambulatory Visit (INDEPENDENT_AMBULATORY_CARE_PROVIDER_SITE_OTHER): Payer: BLUE CROSS/BLUE SHIELD | Admitting: Internal Medicine

## 2015-02-02 ENCOUNTER — Other Ambulatory Visit: Payer: BLUE CROSS/BLUE SHIELD

## 2015-02-02 ENCOUNTER — Other Ambulatory Visit: Payer: Self-pay | Admitting: Internal Medicine

## 2015-02-02 VITALS — BP 112/82 | HR 95 | Ht 62.0 in | Wt 117.6 lb

## 2015-02-02 DIAGNOSIS — J4541 Moderate persistent asthma with (acute) exacerbation: Secondary | ICD-10-CM

## 2015-02-02 MED ORDER — PREDNISONE 20 MG PO TABS: ORAL_TABLET | ORAL | Status: DC

## 2015-02-02 MED ORDER — MONTELUKAST SODIUM 10 MG PO TABS: 10.0000 mg | ORAL_TABLET | Freq: Every day | ORAL | Status: DC

## 2015-02-02 MED ORDER — ALBUTEROL SULFATE HFA 108 (90 BASE) MCG/ACT IN AERS: 2.0000 | INHALATION_SPRAY | Freq: Four times a day (QID) | RESPIRATORY_TRACT | Status: DC | PRN

## 2015-02-02 MED ORDER — AZITHROMYCIN 250 MG PO TABS: ORAL_TABLET | ORAL | Status: DC

## 2015-02-02 NOTE — Assessment & Plan Note (Signed)
Can't tell how long improvement lasted after treatment with short course prednisone and Z-Pak, addition of Dulera. She continues Dulera. We will try acute therapy for bronchitis again. I am not hopeful that she will stay clear if she keeps going back to the intensive air pollution of [place]. This is nonspecific irritant with unknown role of allergen exposures.

## 2015-02-02 NOTE — Progress Notes (Signed)
Subjective:    Patient ID: Sara Trevino, female    DOB: 20-Mar-1965, 50 y.o.   MRN: [id number]  HPI  50  yo with cough chronic and allergies and asthma  Followed by allergy MD, pt is on allergy immunotherapy   TEST  CT chest 11/2013 clear lungs  CT sinus 12/2013 neg for acute process    07/21/14- TP- Acute OV  Patient presents for an acute office visit She complains of prod  cough with yellow and white mucus and increase in chest congestion x 1 month.   She has frequent flares of her asthmatic bronchitis , tx w/ several abx and steroid course. Says she feels better briefly but then symptoms return.  Says she has had chronic cough for last 4years with several different inhaler , allergy vaccines, without much improvement .  Seen 6 weeks ago, tx w/ abx and steroids .  Says recently on steroids again.  Was changed from Advair to Symbicort last ov , but insurance would not cover.  Says insurance will cover Dulera .  CT chest and sinus last year without acute  Process.  CXR in April this year clear.   08/10/14- 52 yo F never smoker referred courtesy of Dr Delford Field for allergy evaluation to consider Nucala therapy for moderate persistent asthma FOLLOWS [id number] for Nucala inj.,  constant cough x 26yrs,  Allergy profile 07/21/14- Total IgE 249  Elevated for dust mite. Grass pollens, tree pollens CBC w diff 07/21/14- EOS  13.6 H Allergy skin test 04/21/11 elevated dog, weed pollens, tree pollens molds She complains primarily of coughing 4 years. Occasional white or yellow sputum. Nasal congestion itching and sneezing                 ? 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 Does not recognize association between symptoms and workplace. She was on allergy vaccine for 3-1/2 years/Dr. [place] Callas, without clear benefit. Says both Dulera and Symbicort caused itching in throat. She expresses concern that she might have cancer in her throat because of  this. Notices facial flushing in the evenings 4 years, before bedtime. No exposure trigger recognized. She is anxious about reported side effects of Xolair from her readings-discussed Has an appointment pending for pulmonary second opinion with Dr Susa Simmonds at [facility].  12/04/2014-50 year old female never smoker followed for moderate persistent asthma/cough, allergic rhinitis Acute Visit: Prod cough (yellow) for 2 weeks, increased sob, wheezing in evenings - Sweats at night - Denies fever Had seen Dr. Lyda Jester for second opinion and he also thought Nucala might be appropriate She is not interested in [place] because it is "too new". She continues allergy vaccine from Dr. [place] Callas and shows me FeNO score of 38 Will get flu shot at work Chronic cough has increased in the last 2 weeks now with yellow brown sputum, wheeze and sweats but no definite fever  [id number] year old female never smoker followed for moderate persistent asthma/cough, allergic rhinitis Allergy Vaccine/Dr. [place] Callas At last visit [facility] was refilled with sample, short-term prescription for prednisone, Z-Pak given and flu shot recommended. PFT 02/23/14 moderate obstructive airways disease, significant response to bronchodilator, air trapping, normal diffusion, normal TLC. FEV1/FVC 0.68 FOLLOWS FOR: Pt states her cough has come back-productive-slight yellow to brown in color. Chest congestion as well. She says Dr. [place] Callas advised her to try a different environment to see if [place] was triggering her cough. So she went to [place] for 6 weeks including 1 week in [place]  with dramatic air pollution exposure. She is about to go back again in February.  Review of Systems ROS-see HPI   Negative unless "+" Constitutional:    weight loss, night sweats, fevers, chills, fatigue, lassitude. HEENT:    headaches, difficulty swallowing, tooth/dental problems, +sore throat,       +sneezing, +itching, ear ache, +nasal congestion, post nasal drip,  snoring CV:    chest pain, orthopnea, PND, swelling in lower extremities, anasarca,                                                     dizziness, palpitations Resp:   +shortness of breath with exertion or at rest.                +productive cough,   +non-productive cough, coughing up of blood.              change in color of mucus.  + wheezing.   Skin:    rash or lesions. GI:  No-   heartburn, indigestion, abdominal pain, nausea, vomiting, diarrhea,                 change in bowel habits, loss of appetite GU: dysuria, change in color of urine, no urgency or frequency.   flank pain. MS:   joint pain, +stiffness, decreased range of motion, back pain. Neuro-     nothing unusual Psych:  change in mood or affect.  depression or anxiety.   memory loss.    Objective:   Physical Exam  OBJ- Physical Exam General- Alert, Oriented, Affect-appropriate, Distress- none acute, + slender Skin- rash-none, lesions- none, excoriation- none Lymphadenopathy- none Head- atraumatic            Eyes- Gross vision intact, PERRLA, conjunctivae and secretions clear            Ears- Hearing, canals-normal            Nose- Clear, no-Septal dev, mucus, polyps, erosion, perforation             Throat- Mallampati II , mucosa clear , drainage- none, tonsils- atrophic Neck- flexible , trachea midline, no stridor , thyroid nl, carotid no bruit Chest - symmetrical excursion , unlabored           Heart/CV- RRR , no murmur , no gallop  , no rub, nl s1 s2                           - JVD- none , edema- none, stasis changes- none, varices- none           Lung- wheeze + bilateral unlabored, cough + dry , dullness-none, rub- none           Chest wall-  Abd-  Br/ Gen/ Rectal- Not done, not indicated Extrem- cyanosis- none, clubbing, none, atrophy- none, strength- nl Neuro- grossly intact to observatio     Assessment & Plan:

## 2015-02-02 NOTE — Patient Instructions (Addendum)
Prescriptions sent to drug store for Ventolin(albuterol) rescue inhaler to use if needed, for Z-pak antibiotic, and for prednisone.  Prescription sent to try Singulair/ montelukast again - It is to reduce inflammation in your airway tubes.  Order- lab- Sputum culture and smear- routine, fungal, AFB  Sample Dulera 200     Inhale 2 puffs then rinse mouth, twice every day- maintenance inhaler   Letter for you to use with you taxes to try to help with cost of replacing carpet

## 2015-02-05 LAB — RESPIRATORY CULTURE OR RESPIRATORY AND SPUTUM CULTURE
Culture: NORMAL
Organism ID, Bacteria: NORMAL

## 2015-02-11 ENCOUNTER — Telehealth: Payer: Self-pay | Admitting: *Deleted

## 2015-02-11 MED ORDER — ALBUTEROL SULFATE HFA 108 (90 BASE) MCG/ACT IN AERS: 2.0000 | INHALATION_SPRAY | Freq: Four times a day (QID) | RESPIRATORY_TRACT | Status: DC | PRN

## 2015-02-11 NOTE — Telephone Encounter (Signed)
Received request for a PA for Ventolin HFA. Insurance is stating preferred is Proair HFA or Proair Respiclick. Please advise if you would like for me to proceed with the PA for Ventolin or send in for Proair. Thanks

## 2015-02-11 NOTE — Telephone Encounter (Signed)
Sent proair HFA per Florentina Addison in place of the Ventolin HFA. LMOM for pt informing of the change. Nothing further needed.

## 2015-03-03 LAB — CULTURE, FUNGUS WITHOUT SMEAR

## 2015-03-18 LAB — AFB CULTURE WITH SMEAR (NOT AT ARMC): Acid Fast Smear: NONE SEEN

## 2015-04-06 ENCOUNTER — Encounter: Payer: Self-pay | Admitting: *Deleted

## 2015-04-09 ENCOUNTER — Ambulatory Visit: Payer: BLUE CROSS/BLUE SHIELD | Admitting: Internal Medicine

## 2015-04-16 ENCOUNTER — Telehealth: Payer: Self-pay | Admitting: Internal Medicine

## 2015-04-16 NOTE — Telephone Encounter (Signed)
Notes Recorded by Lorel MonacoLindsay C Marizol Borror, CMA on 04/06/2015 at 9:22 AM A letter has been sent to the pt to call us back about her results. Notes Recorded by Karleen HampshireMichelle P Gay, CMA on 04/05/2015 at 10:17 AM Left message for patient to call back. Notes Recorded by Ronny BaconKatie C Welchel, CMA on 03/31/2015 at 10:12 AM LMTCB Notes Recorded by Lorel MonacoLindsay C Shigeru Lampert, CMA on 03/26/2015 at 2:32 PM lmtcb x2 for pt. Notes Recorded by Lorel MonacoLindsay C Kynnadi Dicenso, CMA on 03/24/2015 at 3:07 PM lmtcb x1 for pt. Notes Recorded by Waymon Budgelinton D Young, MD on 03/19/2015 at 11:21 AM Final sputum culture reports have come in from January- normal, negative for disease causing bacteria, including TB -------------------------------------- Pt is aware of results. Nothing further was needed.

## 2015-04-19 ENCOUNTER — Telehealth: Payer: Self-pay | Admitting: Internal Medicine

## 2015-04-19 NOTE — Telephone Encounter (Signed)
Spoke with pt. Reports cough and fever. She only wants to see CY. Offered to make an appointment for her with one of the NP's. She declined, states she will call her PCP. Nothing further was needed.

## 2015-04-19 NOTE — Telephone Encounter (Signed)
lmtcb x1 for pt. 

## 2015-04-19 NOTE — Telephone Encounter (Signed)
Patient returned call, requests to be called after 9:30 for an appt today (she has a mtg at 9:00 am), CB [phone].

## 2015-04-28 ENCOUNTER — Other Ambulatory Visit: Payer: Self-pay

## 2015-04-28 DIAGNOSIS — Z1231 Encounter for screening mammogram for malignant neoplasm of breast: Secondary | ICD-10-CM

## 2015-05-04 ENCOUNTER — Encounter: Payer: Self-pay | Admitting: Gastroenterology

## 2015-05-17 ENCOUNTER — Ambulatory Visit
Admission: RE | Admit: 2015-05-17 | Discharge: 2015-05-17 | Disposition: A | Discharge status: Home / Self Care | Payer: BLUE CROSS/BLUE SHIELD | Source: Ambulatory Visit

## 2015-05-17 DIAGNOSIS — Z1231 Encounter for screening mammogram for malignant neoplasm of breast: Secondary | ICD-10-CM

## 2015-06-04 ENCOUNTER — Ambulatory Visit (INDEPENDENT_AMBULATORY_CARE_PROVIDER_SITE_OTHER): Payer: BLUE CROSS/BLUE SHIELD | Admitting: Internal Medicine

## 2015-06-04 ENCOUNTER — Encounter: Payer: Self-pay | Admitting: Internal Medicine

## 2015-06-04 VITALS — BP 112/64 | HR 80 | Ht 62.0 in | Wt 118.0 lb

## 2015-06-04 DIAGNOSIS — J4541 Moderate persistent asthma with (acute) exacerbation: Secondary | ICD-10-CM

## 2015-06-04 DIAGNOSIS — J339 Nasal polyp, unspecified: Secondary | ICD-10-CM | POA: Diagnosis not present

## 2015-06-04 MED ORDER — CIPROFLOXACIN HCL 500 MG PO TABS: 500.0000 mg | ORAL_TABLET | Freq: Two times a day (BID) | ORAL | Status: DC

## 2015-06-04 NOTE — Progress Notes (Signed)
Subjective:    Patient ID: Sara Trevino, female    DOB: 20-Mar-1965, 50 y.o.   MRN: [id number]  HPI  50  yo with cough chronic and allergies and asthma  Followed by allergy MD, pt is on allergy immunotherapy   TEST  CT chest 11/2013 clear lungs  CT sinus 12/2013 neg for acute process    07/21/14- TP- Acute OV  Patient presents for an acute office visit She complains of prod  cough with yellow and white mucus and increase in chest congestion x 1 month.   She has frequent flares of her asthmatic bronchitis , tx w/ several abx and steroid course. Says she feels better briefly but then symptoms return.  Says she has had chronic cough for last 4years with several different inhaler , allergy vaccines, without much improvement .  Seen 6 weeks ago, tx w/ abx and steroids .  Says recently on steroids again.  Was changed from Advair to Symbicort last ov , but insurance would not cover.  Says insurance will cover Dulera .  CT chest and sinus last year without acute  Process.  CXR in April this year clear.   08/10/14- 52 yo F never smoker referred courtesy of Dr Delford Field for allergy evaluation to consider Nucala therapy for moderate persistent asthma FOLLOWS JYN:WGNFAOZHY for Nucala inj.,  constant cough x 26yrs,  Allergy profile 07/21/14- Total IgE 249  Elevated for dust mite. Grass pollens, tree pollens CBC w diff 07/21/14- EOS  13.6 H Allergy skin test 04/21/11 elevated dog, weed pollens, tree pollens molds She complains primarily of coughing 4 years. Occasional white or yellow sputum. Nasal congestion itching and sneezing                 ? 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 Does not recognize association between symptoms and workplace. She was on allergy vaccine for 3-1/2 years/Dr. [place] Callas, without clear benefit. Says both Dulera and Symbicort caused itching in throat. She expresses concern that she might have cancer in her throat because of  this. Notices facial flushing in the evenings 4 years, before bedtime. No exposure trigger recognized. She is anxious about reported side effects of Xolair from her readings-discussed Has an appointment pending for pulmonary second opinion with Dr Susa Simmonds at [facility].  12/04/2014-50 year old female never smoker followed for moderate persistent asthma/cough, allergic rhinitis Acute Visit: Prod cough (yellow) for 2 weeks, increased sob, wheezing in evenings - Sweats at night - Denies fever Had seen Dr. Lyda Jester for second opinion and he also thought Nucala might be appropriate She is not interested in [place] because it is "too new". She continues allergy vaccine from Dr. [place] Callas and shows me FeNO score of 38 Will get flu shot at work Chronic cough has increased in the last 2 weeks now with yellow brown sputum, wheeze and sweats but no definite fever  46 [id number] year old female never smoker followed for moderate persistent asthma/cough, allergic rhinitis Allergy Vaccine/Dr. [place] Callas At last visit [facility] was refilled with sample, short-term prescription for prednisone, Z-Pak given and flu shot recommended. PFT 02/23/14 moderate obstructive airways disease, significant response to bronchodilator, air trapping, normal diffusion, normal TLC. FEV1/FVC 0.68 FOLLOWS FOR: Pt states her cough has come back-productive-slight yellow to brown in color. Chest congestion as well. She says Dr. [place] Callas advised her to try a different environment to see if [place] was triggering her cough. So she went to [place] for 6 weeks including 1 week in [place]  with dramatic air pollution exposure. She is about to go back again in February.  06/04/2015-50 year old female never smoker followed for moderate persistent asthma/cough, allergic rhinitis, nasal polyps Allergy Vaccine-Dr. [place] CallasSharma FOLLOWS FOR: Pt states she continues to cough-productive-yellow in color and slight wheezing at times.  Sputum cultures negative  02/02/2015 She has been to [place] twice this year without improvement in respiratory symptoms. On latest trip she got a sinus infection. [facility]/ Dr Senior who identified nasal polyps and has scheduled a CT of sinuses and recommended bilateral endoscopic sinus surgery. Cough not improved by Singulair or pro air.  Review of Systems ROS-see HPI   Negative unless "+" Constitutional:    weight loss, night sweats, fevers, chills, fatigue, lassitude. HEENT:    headaches, difficulty swallowing, tooth/dental problems, +sore throat,       +sneezing, +itching, ear ache, +nasal congestion, post nasal drip, snoring CV:    chest pain, orthopnea, PND, swelling in lower extremities, anasarca,                                                     dizziness, palpitations Resp:   +shortness of breath with exertion or at rest.                +productive cough,   +non-productive cough, coughing up of blood.              change in color of mucus.  + wheezing.   Skin:    rash or lesions. GI:  No-   heartburn, indigestion, abdominal pain, nausea, vomiting, diarrhea,                 change in bowel habits, loss of appetite GU: dysuria, change in color of urine, no urgency or frequency.   flank pain. MS:   joint pain, +stiffness, decreased range of motion, back pain. Neuro-     nothing unusual Psych:  change in mood or affect.  depression or anxiety.   memory loss.    Objective:   Physical Exam  OBJ- Physical Exam General- Alert, Oriented, Affect-appropriate, Distress- none acute, + slender Skin- rash-none, lesions- none, excoriation- none Lymphadenopathy- none Head- atraumatic            Eyes- Gross vision intact, PERRLA, conjunctivae and secretions clear            Ears- Hearing, canals-normal            Nose- + Mild turbinate edema, no-Septal dev, mucus, polyps, erosion, perforation             Throat- Mallampati II , mucosa clear , drainage- none, tonsils- atrophic Neck- flexible ,  trachea midline, no stridor , thyroid nl, carotid no bruit Chest - symmetrical excursion , unlabored           Heart/CV- RRR , no murmur , no gallop  , no rub, nl s1 s2                           - JVD- none , edema- none, stasis changes- none, varices- none           Lung- wheeze + bilateral unlabored, cough + dry , dullness-none, rub- none           Chest wall-  Abd-  Br/ Gen/ Rectal- Not done, not indicated Extrem- cyanosis- none, clubbing, none, atrophy- none, strength- nl Neuro- grossly intact to observatio     Assessment & Plan:

## 2015-06-04 NOTE — Patient Instructions (Signed)
Script printed for cipro antibiotic for sinus infection  We sent you the on-line article about trying verapamil for chronic sinus disease. You might want to discuss it with Dr Senior  I hope your surgery helps

## 2015-06-06 DIAGNOSIS — J339 Nasal polyp, unspecified: Secondary | ICD-10-CM | POA: Insufficient documentation

## 2015-06-06 NOTE — Assessment & Plan Note (Signed)
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 Plan-she will follow up with her ENT as planned. I showed her a reference reporting use of verapamil to block nasal inflammatory marker "substance P" it worked extremely well for the 1 similar patient on whom I have tried it. She will take a reference to her ENT for his interest and consideration. Cipro given today.

## 2015-06-06 NOTE — Assessment & Plan Note (Signed)
We are hopeful that management of her upper respiratory chronic rhinosinusitis with polyps believed to improve her lower respiratory asthmatic bronchitis. Plan-Cipro

## 2015-06-08 ENCOUNTER — Encounter: Payer: BLUE CROSS/BLUE SHIELD | Admitting: Gastroenterology

## 2015-06-16 ENCOUNTER — Encounter: Payer: Self-pay | Admitting: Unknown Physician Specialty

## 2015-07-02 ENCOUNTER — Encounter: Payer: Self-pay | Admitting: Gastroenterology

## 2015-10-26 ENCOUNTER — Other Ambulatory Visit: Payer: Self-pay | Admitting: Internal Medicine

## 2015-10-26 DIAGNOSIS — R0600 Dyspnea, unspecified: Secondary | ICD-10-CM

## 2015-10-28 ENCOUNTER — Ambulatory Visit
Admission: RE | Admit: 2015-10-28 | Discharge: 2015-10-28 | Disposition: A | Discharge status: Home / Self Care | Payer: BLUE CROSS/BLUE SHIELD | Source: Ambulatory Visit | Attending: Internal Medicine | Admitting: Internal Medicine

## 2015-10-28 DIAGNOSIS — R0600 Dyspnea, unspecified: Secondary | ICD-10-CM

## 2015-10-29 ENCOUNTER — Inpatient Hospital Stay
Admission: RE | Admit: 2015-10-29 | Discharge: 2015-10-29 | Disposition: A | Discharge status: Home / Self Care | Payer: BLUE CROSS/BLUE SHIELD | Source: Ambulatory Visit | Attending: Internal Medicine | Admitting: Internal Medicine

## 2016-01-03 ENCOUNTER — Other Ambulatory Visit: Payer: Self-pay | Admitting: Internal Medicine

## 2016-01-03 ENCOUNTER — Telehealth: Payer: Self-pay | Admitting: Internal Medicine

## 2016-01-03 MED ORDER — EPINEPHRINE 0.3 MG/0.3ML IJ SOAJ: 0.3000 mg | Freq: Once | INTRAMUSCULAR | 0 refills | Status: DC

## 2016-01-03 NOTE — Telephone Encounter (Signed)
Ok to refill Epipen as before, and to mark generic

## 2016-01-03 NOTE — Telephone Encounter (Signed)
Called and spoke to pt. Informed her of the refill. Rx sent to preferred pharmacy. Pt verbalized understanding and denied any further questions or concerns at this time.

## 2016-01-03 NOTE — Telephone Encounter (Signed)
CY  Please Advise-  Pt. Called and wanted to know if she can get a rx for a new epi pen, she stated she is traveling and the one she has is expired.   Last ov-06/04/15

## 2016-02-22 ENCOUNTER — Telehealth: Payer: Self-pay | Admitting: *Deleted

## 2016-02-22 NOTE — Telephone Encounter (Signed)
Called and did the PA for the dulera 200/5 and this has been approved  x 1 year.  Confirmation # [id number]. I have called the pharmacy and they are aware.

## 2016-03-27 ENCOUNTER — Ambulatory Visit: Payer: BLUE CROSS/BLUE SHIELD | Admitting: Internal Medicine

## 2016-06-16 ENCOUNTER — Ambulatory Visit (AMBULATORY_SURGERY_CENTER): Payer: Self-pay

## 2016-06-16 VITALS — Ht 62.0 in | Wt 107.6 lb

## 2016-06-16 DIAGNOSIS — Z1211 Encounter for screening for malignant neoplasm of colon: Secondary | ICD-10-CM

## 2016-06-16 MED ORDER — NA SULFATE-K SULFATE-MG SULF 17.5-3.13-1.6 GM/177ML PO SOLN: 1.0000 | Freq: Once | ORAL | 0 refills | Status: AC

## 2016-06-16 NOTE — Progress Notes (Signed)
Denies allergies to eggs or soy products. Denies complication of anesthesia or sedation. Denies use of weight loss medication. Denies use of O2.   Emmi instructions given for colonoscopy.  

## 2016-06-20 ENCOUNTER — Encounter: Payer: Self-pay | Admitting: Gastroenterology

## 2016-06-22 ENCOUNTER — Ambulatory Visit (INDEPENDENT_AMBULATORY_CARE_PROVIDER_SITE_OTHER): Payer: BLUE CROSS/BLUE SHIELD | Admitting: Internal Medicine

## 2016-06-22 ENCOUNTER — Encounter: Payer: Self-pay | Admitting: Internal Medicine

## 2016-06-22 ENCOUNTER — Other Ambulatory Visit (INDEPENDENT_AMBULATORY_CARE_PROVIDER_SITE_OTHER): Payer: BLUE CROSS/BLUE SHIELD

## 2016-06-22 VITALS — BP 110/68 | HR 86 | Ht 62.0 in | Wt 108.4 lb

## 2016-06-22 DIAGNOSIS — J339 Nasal polyp, unspecified: Secondary | ICD-10-CM

## 2016-06-22 DIAGNOSIS — J4541 Moderate persistent asthma with (acute) exacerbation: Secondary | ICD-10-CM | POA: Diagnosis not present

## 2016-06-22 DIAGNOSIS — J209 Acute bronchitis, unspecified: Secondary | ICD-10-CM | POA: Diagnosis not present

## 2016-06-22 LAB — CBC WITH DIFFERENTIAL/PLATELET
BASOS ABS: 0 10*3/uL (ref 0.0–0.1)
Basophils Relative: 0.5 % (ref 0.0–3.0)
Eosinophils Absolute: 0.4 10*3/uL (ref 0.0–0.7)
Eosinophils Relative: 6.2 % — ABNORMAL HIGH (ref 0.0–5.0)
HCT: 41.2 % (ref 36.0–46.0)
Hemoglobin: 13.7 g/dL (ref 12.0–15.0)
LYMPHS ABS: 1.6 10*3/uL (ref 0.7–4.0)
Lymphocytes Relative: 23.8 % (ref 12.0–46.0)
MCHC: 33.4 g/dL (ref 30.0–36.0)
MCV: 92.1 fl (ref 78.0–100.0)
MONO ABS: 0.5 10*3/uL (ref 0.1–1.0)
MONOS PCT: 6.9 % (ref 3.0–12.0)
NEUTROS ABS: 4.3 10*3/uL (ref 1.4–7.7)
NEUTROS PCT: 62.6 % (ref 43.0–77.0)
PLATELETS: 189 10*3/uL (ref 150.0–400.0)
RBC: 4.47 Mil/uL (ref 3.87–5.11)
RDW: 14.4 % (ref 11.5–15.5)
WBC: 6.9 10*3/uL (ref 4.0–10.5)

## 2016-06-22 MED ORDER — METHYLPREDNISOLONE ACETATE 80 MG/ML IJ SUSP
80.0000 mg | Freq: Once | INTRAMUSCULAR | Status: AC
  Administered 2016-06-22: 80 mg via INTRAMUSCULAR

## 2016-06-22 MED ORDER — LEVALBUTEROL HCL 0.63 MG/3ML IN NEBU
0.6300 mg | INHALATION_SOLUTION | Freq: Once | RESPIRATORY_TRACT | Status: AC
  Administered 2016-06-22: 0.63 mg via RESPIRATORY_TRACT

## 2016-06-22 MED ORDER — AZITHROMYCIN 250 MG PO TABS: 250.0000 mg | ORAL_TABLET | Freq: Every day | ORAL | 0 refills | Status: DC

## 2016-06-22 MED ORDER — LEVALBUTEROL HCL 0.63 MG/3ML IN NEBU
0.6300 mg | INHALATION_SOLUTION | Freq: Four times a day (QID) | RESPIRATORY_TRACT | Status: DC
  Administered 2016-06-22: 0.63 mg via RESPIRATORY_TRACT

## 2016-06-22 NOTE — Patient Instructions (Addendum)
Neb xop 0.63  Depo 80  Script sent for Zpak antibiotic  Order- lab- CBC w diff      Dx acute bronchitis  Please call as needed

## 2016-06-22 NOTE — Progress Notes (Signed)
Subjective:    Patient ID: Sara Trevino, female    DOB: 1965-11-08, 51 y.o.   MRN: [id number]  HPI  Female never smoker followed for moderate persistent asthma/cough, allergic rhinitis, nasal polyps CT chest 11/2013 clear lungs  CT chest 10/28/15- ?? Early MAC? CT sinus 12/2013 neg for acute process    Nasal Polypectomy 06/2016 IgE 08/14/11- 323 Allergy profile 07/21/14- Total IgE 249  Elevated for dust mite. Grass pollens, tree pollens allergy vaccine for 3-1/2 years/Dr. Donneta Romberg, without clear benefit. CBC w diff 07/21/14- EOS  13.6 H Allergy skin test 04/21/11 elevated dog, weed pollens, tree pollens molds PFT 02/23/14 moderate obstructive airways disease, significant response to bronchodilator, air trapping, normal diffusion, normal TLC. FEV1/FVC 0.68 --------------------------------------------------------------------------------------  FOLLOWS FOR: Pt states her cough has come back-productive-slight yellow to brown in color. Chest congestion as well. She says Dr. Donneta Romberg advised her to try a different environment to see if [place] was triggering her cough. So she went to [place] for 6 weeks including 1 week in [place] with dramatic air pollution exposure. She is about to go back again in February.  06/04/2015-51 year old female never smoker followed for moderate persistent asthma/cough, allergic rhinitis, nasal polyps Allergy Vaccine-Dr. Donneta Romberg FOLLOWS FOR: Pt states she continues to cough-productive-yellow in color and slight wheezing at times.  Sputum cultures negative 02/02/2015 She has been to [place] twice this year without improvement in respiratory symptoms. On latest trip she got a sinus infection. She was seen at [facility] by ENT/ Dr Senior who identified nasal polyps and has scheduled a CT of sinuses and recommended bilateral endoscopic sinus surgery. Cough not improved by Singulair or pro air.  06/22/16- 51 year old female never smoker followed for moderate persistent asthma/cough,  allergic rhinitis, nasal polyps ACUTE VISIT patient has a cough it has been going on every day patient is feeling weak for about a week low fever and night sweats and fatque. Had sinus endoscopy surgery for polyps in June. Persistent cough mainly now in the last 3 days associated with fever and postnasal drip. Little wheezing as she continues Symbicort and albuterol as directed. We reviewed her CT images and discussed the possibility of atypical AFB. Sputum cultures -02/02/15 NEG CT chest 10/28/15 IMPRESSION: 1. No findings to suggest interstitial lung disease. 2. 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 At this time, there does not appear to be significant areas of mucoid impaction. 3. Additional incidental findings, as above.  Review of Systems ROS-see HPI   Negative unless "+" Constitutional:    weight loss, night sweats, fevers, chills, fatigue, lassitude. HEENT:    headaches, difficulty swallowing, tooth/dental problems, +sore throat,       +sneezing, +itching, ear ache, +nasal congestion, post nasal drip, snoring CV:    chest pain, orthopnea, PND, swelling in lower extremities, anasarca,                                                     dizziness, palpitations Resp:   +shortness of breath with exertion or at rest.                +productive cough,   +non-productive cough, coughing up of blood.  change in color of mucus.  + wheezing.   Skin:    rash or lesions. GI:  No-   heartburn, indigestion, abdominal pain, nausea, vomiting, diarrhea,                 change in bowel habits, loss of appetite GU: dysuria, change in color of urine, no urgency or frequency.   flank pain. MS:   joint pain, +stiffness, decreased range of motion, back pain. Neuro-     nothing unusual Psych:  change in mood or affect.   depression or anxiety.   memory loss.    Objective:   Physical Exam  OBJ- Physical Exam General- Alert, Oriented, Affect-appropriate, Distress- none acute, + slender Skin- rash-none, lesions- none, excoriation- none Lymphadenopathy- none Head- atraumatic            Eyes- Gross vision intact, PERRLA, conjunctivae and secretions clear            Ears- Hearing, canals-normal            Nose- + Mild turbinate edema, no-Septal dev, mucus, polyps, erosion, perforation             Throat- Mallampati II , mucosa clear , drainage- none, tonsils- atrophic Neck- flexible , trachea midline, no stridor , thyroid nl, carotid no bruit Chest - symmetrical excursion , unlabored           Heart/CV- RRR , no murmur , no gallop  , no rub, nl s1 s2                           - JVD- none , edema- none, stasis changes- none, varices- none           Lung- wheeze + coarse, cough + dry , dullness-none, rub- none           Chest wall-  Abd-  Br/ Gen/ Rectal- Not done, not indicated Extrem- cyanosis- none, clubbing, none, atrophy- none, strength- nl Neuro- grossly intact to observatio     Assessment & Plan:

## 2016-06-25 NOTE — Assessment & Plan Note (Signed)
She refused Nucala because she was concerned it was "too new". We have brought it up again as a possibility. Plan-nebulizer treatment today Xopenex, Depo-Medrol, lab for CBC with differential, Z-Pak

## 2016-06-25 NOTE — Assessment & Plan Note (Signed)
Discussed potential for recurrence. No history suggesting aspirin allergy.

## 2016-06-30 ENCOUNTER — Ambulatory Visit (AMBULATORY_SURGERY_CENTER): Payer: BLUE CROSS/BLUE SHIELD | Admitting: Gastroenterology

## 2016-06-30 ENCOUNTER — Encounter: Payer: Self-pay | Admitting: Gastroenterology

## 2016-06-30 VITALS — BP 106/72 | HR 60 | Temp 98.9°F | Resp 14 | Ht 62.0 in | Wt 107.0 lb

## 2016-06-30 DIAGNOSIS — Z1211 Encounter for screening for malignant neoplasm of colon: Secondary | ICD-10-CM | POA: Diagnosis not present

## 2016-06-30 DIAGNOSIS — Z1212 Encounter for screening for malignant neoplasm of rectum: Secondary | ICD-10-CM

## 2016-06-30 MED ORDER — SODIUM CHLORIDE 0.9 % IV SOLN: 500.0000 mL | INTRAVENOUS | Status: DC

## 2016-06-30 NOTE — Patient Instructions (Addendum)
YOU HAD AN ENDOSCOPIC PROCEDURE TODAY AT THE [facility]:   Refer to the procedure report that was given to you for any specific questions about what was found during the examination.  If the procedure report does not answer your questions, please call your gastroenterologist to clarify.  If you requested that your care partner not be given the details of your procedure findings, then the procedure report has been included in a sealed envelope for you to review at your convenience later.  YOU SHOULD EXPECT: Some feelings of bloating in the abdomen. Passage of more gas than usual.  Walking can help get rid of the air that was put into your GI tract during the procedure and reduce the bloating. If you had a lower endoscopy (such as a colonoscopy or flexible sigmoidoscopy) you may notice spotting of blood in your stool or on the toilet paper. If you underwent a bowel prep for your procedure, you may not have a normal bowel movement for a few days.  Please Note:  You might notice some irritation and congestion in your nose or some drainage.  This is from the oxygen used during your procedure.  There is no need for concern and it should clear up in a day or so.  SYMPTOMS TO REPORT IMMEDIATELY:   Following lower endoscopy (colonoscopy or flexible sigmoidoscopy):  Excessive amounts of blood in the stool  Significant tenderness or worsening of abdominal pains  Swelling of the abdomen that is new, acute  Fever of 100F or higher    For urgent or emergent issues, a gastroenterologist can be reached at any hour by calling [phone].   DIET:  We do recommend a small meal at first, but then you may proceed to your regular diet.  Drink plenty of fluids but you should avoid alcoholic beverages for 24 hours.  ACTIVITY:  You should plan to take it easy for the rest of today and you should NOT DRIVE or use heavy machinery until tomorrow (because of the sedation medicines used during the test).     FOLLOW UP: Our staff will call the number listed on your records the next business day following your procedure to check on you and address any questions or concerns that you may have regarding the information given to you following your procedure. If we do not reach you, we will leave a message.  However, if you are feeling well and you are not experiencing any problems, there is no need to return our call.  We will assume that you have returned to your regular daily activities without incident.  If any biopsies were taken you will be contacted by phone or by letter within the next 1-3 weeks.  Please call us at [phone] if you have not heard about the biopsies in 3 weeks.    SIGNATURES/CONFIDENTIALITY: You and/or your care partner have signed paperwork which will be entered into your electronic medical record.  These signatures attest to the fact that that the information above on your After Visit Summary has been reviewed and is understood.  Full responsibility of the confidentiality of this discharge information lies with you and/or your care-partner.    INFORMATION ON HEMORRHOIDS GIVEN TO YOU  TODAY  APPOINTMENT WITH DR NANDIGAM MAD TO DISCUSS UPPER GI ISSUES (SEE TOP OF THESE INSTRUCTIONS FOR DATE & TIME)

## 2016-06-30 NOTE — Progress Notes (Signed)
A and O x3. Report to RN. Tolerated MAC anesthesia well.

## 2016-06-30 NOTE — Op Note (Signed)
[facility] Patient Name: Sara Trevino Procedure Date: 06/30/2016 10:21 AM MRN: [id number] Endoscopist: Napoleon FormKavitha V. Jayleen Afonso , MD Age: [age] Referring MD:  Date of Birth: 1965/06/02 Gender: Female Account #: [id number] Procedure:                Colonoscopy Indications:              Screening for colorectal malignant neoplasm, This                            is the patient's first colonoscopy Medicines:                Monitored Anesthesia Care Procedure:                Pre-Anesthesia Assessment:                           - Prior to the procedure, a History and Physical                            was performed, and patient medications and                            allergies were reviewed. The patient's tolerance of                            previous anesthesia was also reviewed. The risks                            and benefits of the procedure and the sedation                            options and risks were discussed with the patient.                            All questions were answered, and informed consent                            was obtained. Prior Anticoagulants: The patient has                            taken no previous anticoagulant or antiplatelet                            agents. ASA Grade Assessment: II - A patient with                            mild systemic disease. After reviewing the risks                            and benefits, the patient was deemed in                            satisfactory condition to undergo the procedure.  After obtaining informed consent, the colonoscope                            was passed under direct vision. Throughout the                            procedure, the patient's blood pressure, pulse, and                            oxygen saturations were monitored continuously. The                            Colonoscope was introduced through the anus and                            advanced to the the  cecum, identified by                            appendiceal orifice and ileocecal valve. The                            colonoscopy was performed without difficulty. The                            patient tolerated the procedure well. The quality                            of the bowel preparation was excellent. The                            ileocecal valve, appendiceal orifice, and rectum                            were photographed. Scope In: 10:25:57 AM Scope Out: 10:41:18 AM Scope Withdrawal Time: 0 hours 10 minutes 34 seconds  Total Procedure Duration: 0 hours 15 minutes 21 seconds  Findings:                 The perianal and digital rectal examinations were                            normal.                           Non-bleeding internal hemorrhoids were found during                            retroflexion. The hemorrhoids were small.                           The exam was otherwise without abnormality. Complications:            No immediate complications. Estimated Blood Loss:     Estimated blood loss: none. Impression:               - Non-bleeding internal hemorrhoids.                           -  The examination was otherwise normal.                           - No specimens collected. Recommendation:           - Patient has a contact number available for                            emergencies. The signs and symptoms of potential                            delayed complications were discussed with the                            patient. Return to normal activities tomorrow.                            Written discharge instructions were provided to the                            patient.                           - Resume previous diet.                           - Continue present medications.                           - Repeat colonoscopy in 10 years for screening                            purposes. Napoleon Form, MD 06/30/2016 10:44:45 AM This report has been signed  electronically.

## 2016-07-03 ENCOUNTER — Telehealth: Payer: Self-pay | Admitting: *Deleted

## 2016-07-03 NOTE — Telephone Encounter (Signed)
Left message on f/u call 

## 2016-07-03 NOTE — Telephone Encounter (Signed)
Left message on 2nd callback f/u

## 2016-07-14 ENCOUNTER — Other Ambulatory Visit: Payer: Self-pay | Admitting: Internal Medicine

## 2016-07-21 ENCOUNTER — Ambulatory Visit (INDEPENDENT_AMBULATORY_CARE_PROVIDER_SITE_OTHER): Payer: BLUE CROSS/BLUE SHIELD | Admitting: Gastroenterology

## 2016-07-21 ENCOUNTER — Encounter: Payer: Self-pay | Admitting: Gastroenterology

## 2016-07-21 VITALS — BP 120/68 | HR 68 | Ht 62.25 in | Wt 105.2 lb

## 2016-07-21 DIAGNOSIS — R1013 Epigastric pain: Secondary | ICD-10-CM

## 2016-07-21 DIAGNOSIS — R05 Cough: Secondary | ICD-10-CM

## 2016-07-21 DIAGNOSIS — R059 Cough, unspecified: Secondary | ICD-10-CM

## 2016-07-21 DIAGNOSIS — R1011 Right upper quadrant pain: Secondary | ICD-10-CM | POA: Diagnosis not present

## 2016-07-21 DIAGNOSIS — R12 Heartburn: Secondary | ICD-10-CM

## 2016-07-21 MED ORDER — RANITIDINE HCL 150 MG PO TABS: 150.0000 mg | ORAL_TABLET | Freq: Every day | ORAL | 3 refills | Status: DC

## 2016-07-21 MED ORDER — PANTOPRAZOLE SODIUM 40 MG PO TBEC: 40.0000 mg | DELAYED_RELEASE_TABLET | Freq: Every day | ORAL | 3 refills | Status: DC

## 2016-07-21 NOTE — Progress Notes (Signed)
Sara Trevino    [id number]    02-Nov-1965  Primary Care Physician:Sun, Charyl DancerYun, MD  Referring Physician: Salli RealSun, Yun, MD [address] [place], [place] [address]  Chief complaint:  Right upper quadrant /epigastric abdominal pain , heartburn , bloating , cough  HPI: 51 year old female with history of chronic cough, asthma here with complaints of intermittent right upper quadrant/epigastric discomfort, burning sensation and heartburn. She was prescribed PPI in the past but patient took it for one or 2 days and discontinued it as she did not notice any difference. He has had chronic cough for many years and [facility] and is followed by Dr. Maple HudsonYoung for asthma. She has mostly productive cough with mucus, sometimes dry cough. No improvement of cough after sinus surgery and she also received steroid injection few weeks ago with no significant improvement. Epigastric abdominal pain and heartburn is worse postprandial associated with intermittent nausea but no vomiting. She also has excessive gas and bloating. Bowel habits regular with no melena or blood in stool  Colonoscopy June 2018 was normal other than small internal hemorrhoids   Outpatient Encounter Prescriptions as of 07/21/2016  Medication Sig  . budesonide (PULMICORT) 0.5 MG/2ML nebulizer solution Inhale 1 mL into the lungs as needed.  . budesonide-formoterol (SYMBICORT) 160-4.5 MCG/ACT inhaler Inhale 2 puffs into the lungs 2 (two) times daily.  Marland Kitchen. loratadine (CLARITIN) 10 MG tablet Take 10 mg by mouth daily as needed for allergies.  . Multiple Vitamin (MULTIVITAMIN) tablet Take 1 tablet by mouth daily.  Marland Kitchen. PROAIR HFA 108 (90 Base) MCG/ACT inhaler INHALE 2 PUFFS INTO THE LUNGS EVERY 6 HOURS AS NEEDED FOR WHEEZING AND FOR SHORTNESS OF BREATH   Facility-Administered Encounter Medications as of 07/21/2016  Medication  . 0.9 %  sodium chloride infusion    Allergies as of 07/21/2016  . (No Known Allergies)     Past Medical History:  Diagnosis Date  . Allergy   . Asthma   . Cough   . SOB (shortness of breath)     Past Surgical History:  Procedure Laterality Date  . NASAL SINUS SURGERY  2017    Family History  Problem Relation Age of Onset  . Positive PPD/TB Exposure Mother   . Colon cancer Neg Hx   . Esophageal cancer Neg Hx   . Rectal cancer Neg Hx   . Stomach cancer Neg Hx     Social History   Social History  . Marital status: Married    Spouse name: N/A  . Number of children: 1  . Years of education: N/A   Occupational History  .  [employer]    chemister   Social History Main Topics  . Smoking status: Never Smoker  . Smokeless tobacco: Never Used  . Alcohol use No  . Drug use: No  . Sexual activity: Not on file   Other Topics Concern  . Not on file   Social History Narrative  . No narrative on file      Review of systems: Review of Systems  Constitutional: Negative for fever and chills.  HENT: Negative.   Eyes: Negative for blurred vision.  Respiratory: Negative for cough, shortness of breath and wheezing.   Cardiovascular: Negative for chest pain and palpitations.  Gastrointestinal: as per HPI Genitourinary: Negative for dysuria, urgency, frequency and hematuria.  Musculoskeletal: Negative for myalgias, back pain and joint pain.  Skin: Negative for itching and rash.  Neurological: Negative for  dizziness, tremors, focal weakness, seizures and loss of consciousness.  Endo/Heme/Allergies: Positive for seasonal allergies.  Psychiatric/Behavioral: Negative for depression, suicidal ideas and hallucinations.  All other systems reviewed and are negative.   Physical Exam: Vitals:   07/21/16 0825  BP: 120/68  Pulse: 68   Body mass index is 19.1 kg/m. Gen:      No acute distress HEENT:  EOMI, sclera anicteric Neck:     No masses; no thyromegaly Lungs:    Mild wheezing bilaterally but decreased breath sounds; normal respiratory effort CV:          Regular rate and rhythm; no murmurs Abd:      + bowel sounds; soft, non-tender; no palpable masses, no distension Ext:    No edema; adequate peripheral perfusion Skin:      Warm and dry; no rash Neuro: alert and oriented x 3 Psych: normal mood and affect  Data Reviewed:  Reviewed labs, radiology imaging, old records and pertinent past GI work up   Assessment and Plan/Recommendations:  51 year old female with history of asthma, chronic sinus infection, chronic cough here with complaints of intermittent right upper quadrant/epigastric abdominal pain, heartburn and bloating  Will obtain abdominal ultrasound to exclude gallbladder disease GERD likely playing some role with chronic cough and exacerbation of asthma. Will do a trial of Protonix 40 mg daily, 30 minutes before breakfast and ranitidine 150 mg daily at bedtime Discussed antireflux measures and lifestyle modification in detail If continues to have persistent upper abdominal symptoms will consider EGD for further evaluation  Return in 2 months or sooner if needed  25 minutes was spent face-to-face with the patient. Greater than 50% of the time used for counseling as well as treatment plan and follow-up. She had multiple questions which were answered to her satisfaction  Iona Beard , MD [phone] Mon-Fri 8a-5p [phone] after 5p, weekends, holidays  CC: Salli Real, MD

## 2016-07-21 NOTE — Patient Instructions (Signed)
You have been scheduled for an abdominal ultrasound at [facility] (1st floor of hospital) on 07/28/2016 at 8:30am. Please arrive 15 minutes prior to your appointment for registration. Make certain not to have anything to eat or drink 6 hours prior to your appointment. Should you need to reschedule your appointment, please contact radiology at [phone]. This test typically takes about 30 minutes to perform.  We have sent Zantac and Protonix to your pharmacy   Follow up as needed

## 2016-07-28 ENCOUNTER — Ambulatory Visit (HOSPITAL_COMMUNITY)
Admission: RE | Admit: 2016-07-28 | Discharge: 2016-07-28 | Disposition: A | Discharge status: Home / Self Care | Payer: BLUE CROSS/BLUE SHIELD | Source: Ambulatory Visit | Attending: Gastroenterology | Admitting: Gastroenterology

## 2016-07-28 DIAGNOSIS — R059 Cough, unspecified: Secondary | ICD-10-CM

## 2016-07-28 DIAGNOSIS — R1013 Epigastric pain: Secondary | ICD-10-CM

## 2016-07-28 DIAGNOSIS — K7689 Other specified diseases of liver: Secondary | ICD-10-CM | POA: Diagnosis not present

## 2016-07-28 DIAGNOSIS — R05 Cough: Secondary | ICD-10-CM | POA: Insufficient documentation

## 2016-07-28 DIAGNOSIS — R12 Heartburn: Secondary | ICD-10-CM

## 2016-07-28 DIAGNOSIS — R1011 Right upper quadrant pain: Secondary | ICD-10-CM

## 2016-08-29 ENCOUNTER — Encounter: Payer: Self-pay | Admitting: Gastroenterology

## 2016-08-29 NOTE — Telephone Encounter (Signed)
Error

## 2016-09-19 ENCOUNTER — Telehealth: Payer: Self-pay | Admitting: Internal Medicine

## 2016-09-19 NOTE — Telephone Encounter (Signed)
Left message for her to call back

## 2016-09-20 NOTE — Telephone Encounter (Signed)
Spoke with the pt  She is c/o increased cough with yellow sputum x 2 wks  OV with CDY tomorrow at 11 am  Nothing further needed per pt

## 2016-09-21 ENCOUNTER — Other Ambulatory Visit (INDEPENDENT_AMBULATORY_CARE_PROVIDER_SITE_OTHER): Payer: BLUE CROSS/BLUE SHIELD

## 2016-09-21 ENCOUNTER — Encounter: Payer: Self-pay | Admitting: Internal Medicine

## 2016-09-21 ENCOUNTER — Other Ambulatory Visit: Payer: Self-pay | Admitting: Internal Medicine

## 2016-09-21 ENCOUNTER — Ambulatory Visit (INDEPENDENT_AMBULATORY_CARE_PROVIDER_SITE_OTHER)
Admission: RE | Admit: 2016-09-21 | Discharge: 2016-09-21 | Disposition: A | Discharge status: Home / Self Care | Payer: BLUE CROSS/BLUE SHIELD | Source: Ambulatory Visit | Attending: Internal Medicine | Admitting: Internal Medicine

## 2016-09-21 ENCOUNTER — Ambulatory Visit (INDEPENDENT_AMBULATORY_CARE_PROVIDER_SITE_OTHER): Payer: BLUE CROSS/BLUE SHIELD | Admitting: Internal Medicine

## 2016-09-21 VITALS — BP 120/70 | HR 82 | Ht 62.0 in | Wt 103.2 lb

## 2016-09-21 DIAGNOSIS — J4541 Moderate persistent asthma with (acute) exacerbation: Secondary | ICD-10-CM

## 2016-09-21 DIAGNOSIS — R634 Abnormal weight loss: Secondary | ICD-10-CM

## 2016-09-21 LAB — CBC WITH DIFFERENTIAL/PLATELET
Basophils Absolute: 0 10*3/uL (ref 0.0–0.1)
Basophils Relative: 1.1 % (ref 0.0–3.0)
EOS PCT: 11 % — AB (ref 0.0–5.0)
Eosinophils Absolute: 0.5 10*3/uL (ref 0.0–0.7)
HCT: 44.3 % (ref 36.0–46.0)
HEMOGLOBIN: 14.5 g/dL (ref 12.0–15.0)
Lymphocytes Relative: 27.9 % (ref 12.0–46.0)
Lymphs Abs: 1.3 10*3/uL (ref 0.7–4.0)
MCHC: 32.8 g/dL (ref 30.0–36.0)
MCV: 96.6 fl (ref 78.0–100.0)
MONO ABS: 0.3 10*3/uL (ref 0.1–1.0)
MONOS PCT: 6.3 % (ref 3.0–12.0)
Neutro Abs: 2.5 10*3/uL (ref 1.4–7.7)
Neutrophils Relative %: 53.7 % (ref 43.0–77.0)
Platelets: 196 10*3/uL (ref 150.0–400.0)
RBC: 4.58 Mil/uL (ref 3.87–5.11)
RDW: 13.8 % (ref 11.5–15.5)
WBC: 4.6 10*3/uL (ref 4.0–10.5)

## 2016-09-21 MED ORDER — DOXYCYCLINE HYCLATE 100 MG PO TABS: 100.0000 mg | ORAL_TABLET | Freq: Two times a day (BID) | ORAL | 0 refills | Status: DC

## 2016-09-21 NOTE — Patient Instructions (Addendum)
Order- CXR   Dx asthmatic bronchitis moderate persistent  Order- Sputum culture- routine, fungal, AFB  Order- lab- CBC w diff, IgE, Quantiferon Gold TB assay  Order- schedule PFT  Script sent for doxycycline antibiotic

## 2016-09-21 NOTE — Progress Notes (Signed)
Subjective:    Patient ID: Sara Trevino, female    DOB: December 18, 1965, 51 y.o.   MRN: [id number]  HPI  51 year old female never smoker followed for moderate persistent asthma/chronic cough, allergic rhinitis, nasal polyps Allergy Vaccine-Dr. Girard Callas x 3 years w/o clear benefit CT chest 11/2013 clear lungs  CT sinus 12/2013 neg for acute process  Allergy profile 07/21/14- Total IgE 249  Elevated for dust mite. Grass pollens, tree pollens CBC w diff 07/21/14- EOS  13.6 H Allergy skin test 04/21/11 elevated dog, weed pollens, tree pollens molds  She complains primarily of coughing 5 years. Occasional white or yellow sputum. 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 Does not recognize association between symptoms and workplace Had seen Dr. Lyda Jester for second opinion and he also thought Nucala might be appropriate She was not interested in [place] because it is "too new". PFT 02/23/14 moderate obstructive airways disease, significant response to bronchodilator, air trapping, normal diffusion, normal TLC. FEV1/FVC 0.68,  She was seen at [facility] by ENT/ Dr Senior who identified nasal polyps and has scheduled a CT of sinuses and recommended bilateral endoscopic sinus surgery ----------------------------------------------------------------------------------------------  09/21/16- 51 year old female never smoker followed for moderate persistent asthma/cough, allergic rhinitis, nasal polyps Allergy Vaccine-Dr.  Callas ACUTE VISIT: productive cough-yellow in color, feels flushed, lost 10# in past few months.  Using Symbicort 160,ProAir, nebulizer budesonide No pain. Treatment in June did help for 3 weeks then cough came back. Had nasal polyp surgery June 20. Feels flushed sometimes without fever or chills. Cough now worse again over the last 2 weeks productive yellow mucus. Continues Symbicort, occasional Proair Gradual weight loss 18  pounds over 3 years-decreased appetite SputumC&S 02/02/15-normal flora, no AFB CBC with differential 06/22/16- EOS 6.2 H,  CT chest HiRes 10/28/15    ?? On my review looks like divertic R side trachea??             . IMPRESSION: 1. No findings to suggest interstitial lung disease. 2. 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 At this time, there does not appear to be significant areas of mucoid impaction. 3. Additional incidental findings, as above.  Review of Systems ROS-see HPI   Negative unless "+" Constitutional:    +weight loss, night sweats, fevers, chills, fatigue, lassitude. HEENT:    headaches, difficulty swallowing, tooth/dental problems, +sore throat,       +sneezing, +itching, ear ache, +nasal congestion, post nasal drip, snoring CV:    chest pain, orthopnea, PND, swelling in lower extremities, anasarca,                                                     dizziness, palpitations Resp:   +shortness of breath with exertion or at rest.                +productive cough,   +non-productive cough, coughing up of blood.              change in color of mucus.  + wheezing.   Skin:    rash or lesions. GI:  No-   heartburn, indigestion, abdominal pain, nausea, vomiting, diarrhea,  change in bowel habits, loss of appetite GU: dysuria, change in color of urine, no urgency or frequency.   flank pain. MS:   joint pain, +stiffness, decreased range of motion, back pain. Neuro-     nothing unusual Psych:  change in mood or affect.  depression or anxiety.   memory loss.    Objective:   Physical Exam  OBJ- Physical Exam General- Alert, Oriented, Affect-appropriate, Distress- none acute, + slender Skin- rash-none, lesions- none, excoriation- none Lymphadenopathy- none Head- atraumatic            Eyes- Gross vision  intact, PERRLA, conjunctivae and secretions clear            Ears- Hearing, canals-normal            Nose- + Mild turbinate edema, no-Septal dev, mucus, polyps, erosion, perforation             Throat- Mallampati II , mucosa clear , drainage- none, tonsils- atrophic Neck- flexible , trachea midline, no stridor , thyroid nl, carotid no bruit Chest - symmetrical excursion , unlabored           Heart/CV- RRR , no murmur , no gallop  , no rub, nl s1 s2                           - JVD- none , edema- none, stasis changes- none, varices- none           Lung- +rhonchi mild bilateral/unlabored, cough + wheezy , dullness-none, rub- none           Chest wall-  Abd-  Br/ Gen/ Rectal- Not done, not indicated Extrem- cyanosis- none, clubbing, none, atrophy- none, strength- nl Neuro- grossly intact to observatio     Assessment & Plan:

## 2016-09-22 ENCOUNTER — Telehealth: Payer: Self-pay | Admitting: Internal Medicine

## 2016-09-22 LAB — TIQ-NTM

## 2016-09-22 NOTE — Telephone Encounter (Signed)
Notes recorded by Waymon BudgeYoung, Clinton D, MD on 09/22/2016 at 9:05 AM EDT CXR- normal. No problem seen.  Left detailed message for patient.

## 2016-09-25 DIAGNOSIS — R634 Abnormal weight loss: Secondary | ICD-10-CM | POA: Insufficient documentation

## 2016-09-25 LAB — QUANTIFERON TB GOLD ASSAY (BLOOD)
QUANTIFERON NIL VALUE: 0.06 [IU]/mL
QUANTIFERON(R)-TB GOLD: POSITIVE — AB
Quantiferon Tb Ag Minus Nil Value: 0.47 IU/mL

## 2016-09-25 LAB — IGE: IGE (IMMUNOGLOBULIN E), SERUM: 301 kU/L — AB (ref <=114)

## 2016-09-25 NOTE — Assessment & Plan Note (Signed)
Unexplained weight loss approximately 20 pounds over 3 years. This could be stress, could be associated with her chronic cough, or a separate process. Plan- updating CXR and check CBC and Quant TB Gold

## 2016-09-25 NOTE — Assessment & Plan Note (Addendum)
Pattern is becoming a moderate persistent asthmatic bronchitis with exacerbation over the past 2 weeks. Strong allergy markers but did not seem to benefit from 3 years on allergy vaccine. If follow-up labs are consistent, I would like again to talk with her about using one of the Biologics. Plan-reculture sputum. Watching for atypical AFB. Re-eval a date allergy activation status with total IgE and eosinophil counts. Update CXR. Doxy.

## 2016-09-26 LAB — FUNGUS CULTURE W SMEAR

## 2016-09-27 LAB — TEST AUTHORIZATION

## 2016-09-27 LAB — RESPIRATORY CULTURE OR RESPIRATORY AND SPUTUM CULTURE
MICRO NUMBER: 80981502
RESULT:: NORMAL
SPECIMEN QUALITY:: ADEQUATE

## 2016-09-27 LAB — FUNGUS CULTURE W SMEAR

## 2016-09-28 ENCOUNTER — Other Ambulatory Visit: Payer: Self-pay | Admitting: Internal Medicine

## 2016-09-28 DIAGNOSIS — J4541 Moderate persistent asthma with (acute) exacerbation: Secondary | ICD-10-CM

## 2016-09-29 ENCOUNTER — Other Ambulatory Visit: Payer: BLUE CROSS/BLUE SHIELD

## 2016-10-02 ENCOUNTER — Other Ambulatory Visit: Payer: BLUE CROSS/BLUE SHIELD

## 2016-10-02 ENCOUNTER — Other Ambulatory Visit: Payer: Self-pay | Admitting: Internal Medicine

## 2016-10-02 DIAGNOSIS — J4541 Moderate persistent asthma with (acute) exacerbation: Secondary | ICD-10-CM

## 2016-10-03 ENCOUNTER — Telehealth: Payer: Self-pay | Admitting: *Deleted

## 2016-10-03 NOTE — Telephone Encounter (Signed)
-----   Message from Nyoka Cowden, MD sent at 10/03/2016  8:51 AM EDT ----- Dr young wants me to see her for second opinion and possible bronch so set up 15 min ov as add on w/in the next week if possible

## 2016-10-03 NOTE — Telephone Encounter (Signed)
Spoke with the pt and scheduled ov with MW for 3:45 on 10/04/16

## 2016-10-04 ENCOUNTER — Other Ambulatory Visit (INDEPENDENT_AMBULATORY_CARE_PROVIDER_SITE_OTHER): Payer: BLUE CROSS/BLUE SHIELD

## 2016-10-04 ENCOUNTER — Encounter: Payer: Self-pay | Admitting: Internal Medicine

## 2016-10-04 ENCOUNTER — Ambulatory Visit (INDEPENDENT_AMBULATORY_CARE_PROVIDER_SITE_OTHER): Payer: BLUE CROSS/BLUE SHIELD | Admitting: Internal Medicine

## 2016-10-04 ENCOUNTER — Telehealth: Payer: Self-pay | Admitting: *Deleted

## 2016-10-04 VITALS — BP 108/70 | HR 80 | Temp 98.2°F | Ht 62.0 in | Wt 102.8 lb

## 2016-10-04 DIAGNOSIS — J45991 Cough variant asthma: Secondary | ICD-10-CM | POA: Insufficient documentation

## 2016-10-04 DIAGNOSIS — R9389 Abnormal findings on diagnostic imaging of other specified body structures: Secondary | ICD-10-CM | POA: Insufficient documentation

## 2016-10-04 DIAGNOSIS — R938 Abnormal findings on diagnostic imaging of other specified body structures: Secondary | ICD-10-CM

## 2016-10-04 DIAGNOSIS — R634 Abnormal weight loss: Secondary | ICD-10-CM

## 2016-10-04 LAB — HEPATIC FUNCTION PANEL
ALBUMIN: 4.1 g/dL (ref 3.5–5.2)
ALT: 13 U/L (ref 0–35)
AST: 18 U/L (ref 0–37)
Alkaline Phosphatase: 40 U/L (ref 39–117)
Bilirubin, Direct: 0.1 mg/dL (ref 0.0–0.3)
TOTAL PROTEIN: 7 g/dL (ref 6.0–8.3)
Total Bilirubin: 0.4 mg/dL (ref 0.2–1.2)

## 2016-10-04 LAB — BASIC METABOLIC PANEL
BUN: 17 mg/dL (ref 6–23)
CALCIUM: 9.6 mg/dL (ref 8.4–10.5)
CO2: 30 meq/L (ref 19–32)
Chloride: 104 mEq/L (ref 96–112)
Creatinine, Ser: 0.78 mg/dL (ref 0.40–1.20)
GFR: 82.3 mL/min (ref >60.00)
GLUCOSE: 98 mg/dL (ref 70–99)
Potassium: 3.6 mEq/L (ref 3.5–5.1)
Sodium: 138 mEq/L (ref 135–145)

## 2016-10-04 LAB — SEDIMENTATION RATE: Sed Rate: 5 mm/hr (ref 0–30)

## 2016-10-04 LAB — TSH: TSH: 1.17 u[IU]/mL (ref 0.35–4.50)

## 2016-10-04 MED ORDER — BUDESONIDE-FORMOTEROL FUMARATE 80-4.5 MCG/ACT IN AERO: 2.0000 | INHALATION_SPRAY | Freq: Two times a day (BID) | RESPIRATORY_TRACT | 0 refills | Status: DC

## 2016-10-04 MED ORDER — BUDESONIDE-FORMOTEROL FUMARATE 80-4.5 MCG/ACT IN AERO: 2.0000 | INHALATION_SPRAY | Freq: Two times a day (BID) | RESPIRATORY_TRACT | 11 refills | Status: DC

## 2016-10-04 MED ORDER — RABEPRAZOLE SODIUM 20 MG PO TBEC: 20.0000 mg | DELAYED_RELEASE_TABLET | Freq: Every day | ORAL | 11 refills | Status: DC

## 2016-10-04 MED ORDER — FAMOTIDINE 20 MG PO TABS: ORAL_TABLET | ORAL | 2 refills | Status: DC

## 2016-10-04 NOTE — Telephone Encounter (Signed)
-----   Message from Nyoka Cowden, MD sent at 10/04/2016 10:26 AM EDT ----- This is the pt needs to bring all active meds today with her

## 2016-10-04 NOTE — Telephone Encounter (Signed)
Spoke with the pt and notified her to bring all of her active meds with her to her appt today  She verbalized understanding

## 2016-10-04 NOTE — Assessment & Plan Note (Signed)
Quantiferon Gold TB 09/21/16 barely positive - 09/21/16  Sputum >  afb neg smear, nl flora >>>  See CT chest 09/27/16 with thickening of a few bronchial tubes, wispy change RUL ant and RML   With an ESR of only 5 I doubt she has active TB or any other infection at this point but share Dr Janee Morn concern we need to rule TB out to the extent possible and even go ahead and consider treatment as she is freq exposed to systemic (and chronically to topical steroids in high doses).   Likely heading toward FOB once we get the second sputum back but for now focus on control of her airways (see separate a/p)

## 2016-10-04 NOTE — Patient Instructions (Addendum)
Aciphex 20 mg Take 30-60 min before first meal of the day and pepcid 20 mg at bedtime   GERD (REFLUX)  is an extremely common cause of respiratory symptoms just like yours , many times with no obvious heartburn at all.    It can be treated with medication, but also with lifestyle changes including elevation of the head of your bed (ideally with 6 inch  bed blocks),  Smoking cessation, avoidance of late meals, excessive alcohol, and avoid fatty foods, chocolate, peppermint, colas, red wine, and acidic juices such as orange juice.  NO MINT OR MENTHOL PRODUCTS SO NO COUGH DROPS   USE SUGARLESS CANDY INSTEAD (Jolley ranchers or Stover's or Life Savers) or even ice chips will also do - the key is to swallow to prevent all throat clearing. NO OIL BASED VITAMINS - use powdered substitutes.   Change symbicort to 80 Take 2 puffs first thing in am and then another 2 puffs about 12 hours later.   Work on inhaler technique:  relax and gently blow all the way out then take a nice smooth deep breath back in, triggering the inhaler at same time you start breathing in.  Hold for up to 5 seconds if you can. Blow out thru nose. Rinse and gargle with water when done  - if you want to use a spacer that's ok but bring it with you so we can be sure it's working properly   Only use your albuterol as a rescue medication to be used if you can't catch your breath by resting or doing a relaxed purse lip breathing pattern.  - The less you use it, the better it will work when you need it. - Ok to use up to 2 puffs  every 4 hours if you must but call for immediate appointment if use goes up over your usual need - Don't leave home without it !!  (think of it like the spare tire for your car)   For cough >  mucinex dm 1200 mg every 12 hours as needed  Please remember to go to the lab department downstairs in the basement  for your tests - we will call you with the results when they are available.      Please schedule a  follow up office visit in 2 weeks, sooner if needed  with all medications /inhalers/ solutions in hand so we can verify exactly what you are taking. This includes all medications from all doctors and over the counters - add augumentin x 10 days, consider flutter next ov

## 2016-10-04 NOTE — Progress Notes (Signed)
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 Needs fob due to Pos Quantiferon in pt who reporting pos PPD dating back to 2012 with dx of severe asthma.    10/04/2016 1st [facility] office visit/ Sara Trevino  maint rx symb 160 2bid but hfa quite poor, could not tol protonix so just on pepcid one daliy  Chief Complaint  Patient presents with  . Follow-up    Pt of Dr Roxy Cedar here to discuss possible bronchoscopy. Pt c/o increased SOB x 2 wks. She is coughing more and producing large amounts of yellow sputum.  She states also having night sweats and fatigue.   prednisone worked the best per DrWright / Wiley Ford Callas New problem is sweats and wt loss (with cough since 2012 x more prod x  sev months and cough much worse x 2 weeks  No better p doxy / sputum submitted for afb pending  Cough worse each am than remainer of day and some worse again at hs with large amt of yellow mucus/ cultures pending/ has 2 days left of doxy and no change in mucus yet  Not limited by breathing from desired activities    No obvious day to day or daytime variability or assoc   mucus plugs or hemoptysis or cp or chest tightness, subjective wheeze or overt sinus or hb symptoms. No unusual exp hx or h/o childhood pna/ asthma or knowledge of premature birth.  Sleeping ok flat without nocturnal  or early am exacerbation  of respiratory  c/o's or need for noct saba. Also denies any obvious fluctuation of symptoms with weather or environmental changes or other aggravating or alleviating factors except as outlined above   Current Allergies, Complete Past Medical History, Past Surgical History, Family History, and Social History were reviewed in  [facility] [facility] record.  ROS  The following are not active complaints unless bolded sore throat, dysphagia, dental problems, itching, sneezing,  nasal congestion or disharge of excess mucus or purulent secretions, ear ache,   fever, chills, sweats, unintended wt loss or wt gain, classically pleuritic or exertional cp,  orthopnea pnd or leg swelling, presyncope, palpitations, abdominal pain, anorexia, nausea, vomiting, diarrhea  or change in bowel habits or bladder habits, change in stools or change in urine, dysuria, hematuria,  rash, arthralgias, visual complaints, headache, numbness, weakness or ataxia or problems with walking or coordination,  change in mood/affect or memory.        Current Meds  Medication Sig  . doxycycline (VIBRA-TABS) 100 MG tablet Take 1 tablet (100 mg total) by mouth 2 (two) times daily.  . Multiple Vitamin (MULTIVITAMIN) tablet Take 1 tablet by mouth daily.  Marland Kitchen PROAIR HFA 108 (90 Base) MCG/ACT inhaler INHALE 2 PUFFS INTO THE LUNGS EVERY 6 HOURS AS NEEDED FOR WHEEZING AND FOR SHORTNESS OF BREATH  . [DISCONTINUED] budesonide-formoterol (SYMBICORT) 160-4.5 MCG/ACT inhaler Inhale 2 puffs into the lungs 2 (two) times daily.  . [DISCONTINUED] loratadine (CLARITIN) 10 MG tablet Take 10 mg by mouth daily as needed for allergies.                Review of  Systems     Objective:   Physical Exam    amb thin anxious asian female very difficult to understand but says"I understand everything you say"  Wt Readings from Last 3 Encounters:  10/04/16 102 lb 12.8 oz (46.6 kg)  09/21/16 103 lb 3.2 oz (46.8 kg)  07/21/16 105 lb 4 oz (47.7 kg)    Vital signs reviewed  - Note on arrival 02 sats  98% on RA   HEENT: nl dentition, turbinates bilaterally, and oropharynx. Nl external ear canals without cough reflex   NECK :  without JVD/Nodes/TM/ nl carotid upstrokes bilaterally   LUNGS: no acc muscle use,  Nl contour chest with very poor air movement  bilaterally and severe coughing fits on insp and exp    CV:  RRR  no s3 or murmur or increase in P2, and no edema   ABD:  soft and nontender with nl inspiratory excursion in the supine position. No bruits or organomegaly appreciated, bowel sounds nl  MS:  Nl gait/ ext warm without deformities, calf tenderness, cyanosis or clubbing No obvious joint restrictions   SKIN: warm and dry without lesions    NEURO:  alert, approp, nl sensorium with  no motor or cerebellar deficits apparent.       Labs ordered/ reviewed:      Chemistry      Component Value Date/Time   NA 138 10/04/2016 1632   K 3.6 10/04/2016 1632   CL 104 10/04/2016 1632   CO2 30 10/04/2016 1632   BUN 17 10/04/2016 1632   CREATININE 0.78 10/04/2016 1632      Component Value Date/Time   CALCIUM 9.6 10/04/2016 1632   ALKPHOS 40 10/04/2016 1632   AST 18 10/04/2016 1632   ALT 13 10/04/2016 1632   BILITOT 0.4 10/04/2016 1632        Lab Results  Component Value Date   WBC 4.6 09/21/2016   HGB 14.5 09/21/2016   HCT 44.3 09/21/2016   MCV 96.6 09/21/2016   PLT 196.0 09/21/2016         Lab Results  Component Value Date   TSH 1.17 10/04/2016        Lab Results  Component Value Date   ESRSEDRATE 5 10/04/2016          Assessment:

## 2016-10-05 ENCOUNTER — Telehealth: Payer: Self-pay | Admitting: *Deleted

## 2016-10-05 MED ORDER — AMOXICILLIN-POT CLAVULANATE 875-125 MG PO TABS: 1.0000 | ORAL_TABLET | Freq: Two times a day (BID) | ORAL | 0 refills | Status: DC

## 2016-10-05 NOTE — Assessment & Plan Note (Signed)
This is the most bothersome aspect of her care and assoc with sweats suggests a serious fuo though she's having no chills and eating well s diarrhea with nl tsh and esr so I am at a loss to explain it but strongly doubt it's from any active M TB infection at this point and will await 3 sputums since she has productive cough

## 2016-10-05 NOTE — Telephone Encounter (Signed)
-----   Message from Nyoka Cowden, MD sent at 10/05/2016  6:01 AM EDT ----- p chart review rec  Augmentin 875 mg take one pill twice daily  X 10 days - take at breakfast and supper with large glass of water.  It would help reduce the usual side effects (diarrhea and yeast infections) if you ate cultured yogurt at lunch.

## 2016-10-05 NOTE — Telephone Encounter (Signed)
Spoke with the pt and notified of recs per MW  She verbalized understanding and rx was sent  

## 2016-10-05 NOTE — Assessment & Plan Note (Signed)
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 / unable to tol nasal steroids - 10/04/2016  After extensive coaching HFA effectiveness =    75% from a baseline of nearly 0 > try symb 80 2bid and blow out thru nose  - Augmentin 10/05/2016  x 10 days then recheck sinus CT   DDX of  difficult airways management almost all start with A and  include Adherence, Ace Inhibitors, Acid Reflux, Active Sinus Disease, Alpha 1 Antitripsin deficiency, Anxiety masquerading as Airways dz,  ABPA,  Allergy(esp in young), Aspiration (esp in elderly), Adverse effects of meds,  Active smokers, A bunch of PE's (a small clot burden can't cause this syndrome unless there is already severe underlying pulm or vascular dz with poor reserve) plus two Bs  = Bronchiectasis and Beta blocker use..and one C= CHF   Adherence is always the initial "prime suspect" and is a multilayered concern that requires a "trust but verify" approach in every patient - starting with knowing how to use medications, especially inhalers, correctly, keeping up with refills and understanding the fundamental difference between maintenance and prns vs those medications only taken for a very short course and then stopped and not refilled.  - see hfa teaching - language/ culture issues may impede communications re expectations of care, advised to bring her son with her at next ov to regroup re this issue - return with all meds in hand using a trust but verify approach to confirm accurate Medication  Reconciliation The principal here is that until we are certain that the  patients are doing what we've asked, it makes no sense to ask them to do more.   Allergy/ asthma > not able to tolerate topical nasal steroids per chart and now we find can't use ICS either but I think she can be trained and if not change to bud neb next ov   ? Active sinusitis > might  explaine cough / sweats, wt loss because ct chest certainly does not > try augmentin x 10 days then ct sinus  ? Anxiety/depression> usually at the bottom of this list of usual suspects but should be much higher on this pt's based on H and P  And may also explain some of her symtpoms  ? Bronchiectasis > very minimal changes on hrct > flutter valve may help here   Will return in 2 weeks to regroup    I had an extended discussion with the patient reviewing all relevant studies completed to date and  lasting 25 minutes of a 40  minute office visit with extremely complex pt new to me    re  severe non-specific but potentially very serious refractory respiratory symptoms of uncertain and potentially multiple  etiologies.   Each maintenance medication was reviewed in detail including most importantly the difference between maintenance and prns and under what circumstances the prns are to be triggered using an action plan format that is not reflected in the computer generated alphabetically organized AVS.    Please see AVS for specific instructions unique to this office visit that I personally wrote and verbalized to the the pt in detail and then reviewed with pt  by my nurse highlighting any changes in therapy/plan of care  recommended at today's visit.

## 2016-10-09 ENCOUNTER — Telehealth: Payer: Self-pay | Admitting: Internal Medicine

## 2016-10-09 MED ORDER — CEFDINIR 300 MG PO CAPS: 300.0000 mg | ORAL_CAPSULE | Freq: Two times a day (BID) | ORAL | 0 refills | Status: DC

## 2016-10-09 NOTE — Telephone Encounter (Signed)
Spoke with pt she states the Augmentin, she is eating with them and it still hurts her stomach so she stopped. It was also making her have diarrhea. MW did you want to send something else in for pt? Please advise.

## 2016-10-09 NOTE — Telephone Encounter (Signed)
Spoke with patient. She is aware to stop taking augmentin and doxy. Truman Hayward will be called into pharmacy.

## 2016-10-09 NOTE — Progress Notes (Signed)
LMTCB- need to tell her still awaiting sputum

## 2016-10-09 NOTE — Telephone Encounter (Signed)
D/c augmentin and doxy and take omnicef 300 bid x 10 days

## 2016-10-23 ENCOUNTER — Encounter: Payer: Self-pay | Admitting: Internal Medicine

## 2016-10-23 ENCOUNTER — Ambulatory Visit (INDEPENDENT_AMBULATORY_CARE_PROVIDER_SITE_OTHER): Payer: BLUE CROSS/BLUE SHIELD | Admitting: Internal Medicine

## 2016-10-23 VITALS — BP 108/60 | HR 69 | Ht 60.0 in | Wt 103.0 lb

## 2016-10-23 DIAGNOSIS — J45991 Cough variant asthma: Secondary | ICD-10-CM | POA: Diagnosis not present

## 2016-10-23 DIAGNOSIS — R9389 Abnormal findings on diagnostic imaging of other specified body structures: Secondary | ICD-10-CM | POA: Diagnosis not present

## 2016-10-23 NOTE — Assessment & Plan Note (Signed)
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 Even more so p prednisone with most likely scenario that of rhinitis/ sinusitis >> asthma in any form but hard to tell with multiple providers and easily confused pt   rec Try ppi again ac x one month to see if any benefit and / or whether she's really intolerant (for future reference hate to categorically dismiss future rx with ppi  - if can't take ppi then h2 bid same reasoning  Continue symb 80 2bid and f/u [facility] pulmonary as planned  F/u [facility] ent as planned also

## 2016-10-23 NOTE — Assessment & Plan Note (Signed)
10/23/2016  After extensive coaching HFA effectiveness =    90% with spacer

## 2016-10-23 NOTE — Patient Instructions (Addendum)
rechallenge with aciphex (Rabeprazole) 20 mg Take 30-60 min before first meal of the day to see if it helps your daytime cough with voice use but stop if not helping   If can't tolerate the aciphex(reabeperazole)  for any reason, then stop it and change to famotidine 20 mg after bfast and supper for a month long trial then stop it if not helping    GERD (REFLUX)  is an extremely common cause of respiratory symptoms just like yours , many times with no obvious heartburn at all.    It can be treated with medication, but also with lifestyle changes including elevation of the head of your bed (ideally with 6 inch  bed blocks),  Smoking cessation, avoidance of late meals, excessive alcohol, and avoid fatty foods, chocolate, peppermint, colas, red wine, and acidic juices such as orange juice.  NO MINT OR MENTHOL PRODUCTS SO NO COUGH DROPS   USE SUGARLESS CANDY INSTEAD (Jolley ranchers or Stover's or Life Savers) or even ice chips will also do - the key is to swallow to prevent all throat clearing. NO OIL BASED VITAMINS - use powdered substitutes.  Stay on symbicort 80 Take 2 puffs first thing in am and then another 2 puffs about 12 hours later.   Work on inhaler technique:  relax and gently blow all the way out then take a nice smooth deep breath back in, triggering the inhaler right before  you start breathing in via the spacer.  Hold for up to 5 seconds if you can. Blow out thru nose. Rinse and gargle with water when done      Please keep appt with your sinus Doctor at [place]   Return here if needed once you complete your follow up with [facility]

## 2016-10-23 NOTE — Assessment & Plan Note (Addendum)
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

## 2016-10-23 NOTE — Progress Notes (Signed)
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 Needs fob due to Pos Quantiferon in pt who reporting pos PPD dating back to 2012 with dx of severe asthma.    History of Present Illness  10/04/2016 1st [place] [facility] office visit/ Rexford Prevo  maint rx symb 160 2bid but hfa quite poor, could not tol protonix so just on pepcid one daliy  Chief Complaint  Patient presents with  . Follow-up    Pt of Dr Roxy Cedar here to discuss possible bronchoscopy. Pt c/o increased SOB x 2 wks. She is coughing more and producing large amounts of yellow sputum.  She states also having night sweats and fatigue.   prednisone worked the best per DrWright / [place] Callas New problem is sweats and wt loss (with cough since 2012 x more prod x  sev months and cough much worse x 2 weeks  No better p doxy / sputum submitted for afb pending  Cough worse each am than remainer of day and some worse again at hs with large amt of yellow mucus/ cultures pending/ has 2 days left of doxy and no change in mucus yet rec Aciphex 20 mg Take 30-60 min before first meal of the day and pepcid 20 mg at bedtime  GERD diet  Change symbicort to 80 Take 2 puffs first thing in am and then another 2 puffs about 12 hours later.  Work on inhaler technique:  Only use your albuterol as a rescue medication For cough >  mucinex dm 1200 mg every 12 hours as needed Please remember to go to the lab department downstairs in the basement  for your tests - we will call you with the results when they are available.   Please schedule a follow up office visit in 2 weeks, sooner if needed  with all medications /inhalers/ solutions in hand so we can verify exactly what  you are taking. This includes all medications from all doctors and over the counters - add omnicef x 10 days, consider flutter next ov       10/23/2016  f/u ov/Zebediah Beezley re:  Started prednisone x 6 day prior to OV  But states already improving prior on symb 80 2bid Chief Complaint  Patient presents with  . Follow-up    Cough has improved some. She still occ has some SOB.   cough is much better but not really clear why - ? Main change was with abx as never took the  gerd rx  And improved before the pred added by [facility] pulmonary which is considering adding nucala to rx but HRCT neg so told her she did not need fob and already has f[facility]u planned at [facility] as well.  Unless coughing really Not limited by breathing from desired activities    Told me cough was more daytime, esp early am and "sometimes noct"  - note  told [facility] opposite Sweats/ wt loss gone now   No obvious day to day or daytime variability or assoc excess/ purulent sputum or mucus plugs or hemoptysis or cp or chest tightness, subjective wheeze or overt sinus or hb  symptoms. No unusual exp hx or h/o childhood pna/ asthma or knowledge of premature birth.  Sleeping ok flat without nocturnal  or early am exacerbation  of respiratory  c/o's or need for noct saba. Also denies any obvious fluctuation of symptoms with weather or environmental changes or other aggravating or alleviating factors except as outlined above   Current Allergies, Complete Past Medical History, Past Surgical History, Family History, and Social History were reviewed in [employer] record.  ROS  The following are not active complaints unless bolded Hoarseness, sore throat, dysphagia, dental problems, itching, sneezing,  nasal congestion or discharge of excess mucus or purulent secretions, ear ache,   fever, chills, sweats, unintended wt loss or wt gain, classically pleuritic or exertional cp,  orthopnea pnd or leg swelling, presyncope, palpitations,  abdominal pain, anorexia, nausea, vomiting, diarrhea  or change in bowel habits or change in bladder habits, change in stools or change in urine, dysuria, hematuria,  rash, arthralgias, visual complaints, headache, numbness, weakness or ataxia or problems with walking or coordination,  change in mood/affect or memory.        Current Meds  Medication Sig  . budesonide-formoterol (SYMBICORT) 80-4.5 MCG/ACT inhaler Inhale 2 puffs into the lungs 2 (two) times daily.        . predniSONE (DELTASONE) 10 MG tablet Taper as directed  . PROAIR HFA 108 (90 Base) MCG/ACT inhaler INHALE 2 PUFFS INTO THE LUNGS EVERY 6 HOURS AS NEEDED FOR WHEEZING AND FOR SHORTNESS OF BREATH               Objective:   Physical Exam    amb thin anxious asian female very difficult to understand    10/23/2016       103   10/04/16 102 lb 12.8 oz (46.6 kg)  09/21/16 103 lb 3.2 oz (46.8 kg)  07/21/16 105 lb 4 oz (47.7 kg)    Vital signs reviewed  - Note on arrival 02 sats  97% on RA   HEENT: nl dentition, turbinates bilaterally, and oropharynx. Nl external ear canals without cough reflex   NECK :  without JVD/Nodes/TM/ nl carotid upstrokes bilaterally   LUNGS: no acc muscle use,  Nl contour chest with  Clear to A and P bilaterally no longer cough on insp/ exp   CV:  RRR  no s3 or murmur or increase in P2, and no edema   ABD:  soft and nontender with nl inspiratory excursion in the supine position. No bruits or organomegaly appreciated, bowel sounds nl  MS:  Nl gait/ ext warm without deformities, calf tenderness, cyanosis or clubbing No obvious joint restrictions   SKIN: warm and dry without lesions    NEURO:  alert, approp, nl sensorium with  no motor or cerebellar deficits apparent.       Labs   reviewed:      Chemistry      Component Value Date/Time   NA 138 10/04/2016 1632   K 3.6 10/04/2016 1632   CL 104 10/04/2016 1632   CO2 30 10/04/2016 1632   BUN 17 10/04/2016 1632   CREATININE 0.78  10/04/2016 1632      Component Value Date/Time   CALCIUM 9.6 10/04/2016 1632   ALKPHOS 40 10/04/2016 1632   AST 18 10/04/2016 1632   ALT 13 10/04/2016 1632   BILITOT 0.4 10/04/2016 1632        Lab Results  Component Value Date   WBC 4.6 09/21/2016   HGB 14.5 09/21/2016  HCT 44.3 09/21/2016   MCV 96.6 09/21/2016   PLT 196.0 09/21/2016        EOS                      0.5       09/21/16                Lab Results  Component Value Date   TSH 1.17 10/04/2016        Lab Results  Component Value Date   ESRSEDRATE 5 10/04/2016         CT 10/11/16 HRCT AIRWAYS, LUNGS, PLEURA:  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 Clear central tracheobronchial airways. No lung consolidation. No  groundglass opacity or honeycombing. No pleural effusion.  MEDIASTINUM:  Normal heart size. No pericardial effusion. Normal caliber thoracic aorta. No mediastinal lymphadenopathy.   Assessment:

## 2016-11-01 ENCOUNTER — Telehealth: Payer: Self-pay | Admitting: Internal Medicine

## 2016-11-01 NOTE — Telephone Encounter (Signed)
Spoke with patient. She stated that she was returning a call from our office. Advised patient that per her chart, no one had called her from this office. She then stated that someone had called to confirm her appts tomorrow.    Nothing else was needed at time of call.

## 2016-11-02 ENCOUNTER — Encounter: Payer: Self-pay | Admitting: Internal Medicine

## 2016-11-02 ENCOUNTER — Ambulatory Visit (INDEPENDENT_AMBULATORY_CARE_PROVIDER_SITE_OTHER): Payer: BLUE CROSS/BLUE SHIELD | Admitting: Internal Medicine

## 2016-11-02 DIAGNOSIS — J4541 Moderate persistent asthma with (acute) exacerbation: Secondary | ICD-10-CM | POA: Diagnosis not present

## 2016-11-02 DIAGNOSIS — R7612 Nonspecific reaction to cell mediated immunity measurement of gamma interferon antigen response without active tuberculosis: Secondary | ICD-10-CM | POA: Diagnosis not present

## 2016-11-02 DIAGNOSIS — J339 Nasal polyp, unspecified: Secondary | ICD-10-CM | POA: Diagnosis not present

## 2016-11-02 LAB — PULMONARY FUNCTION TEST
DL/VA % pred: 105 %
DL/VA: 5.07 ml/min/mmHg/L
DLCO COR: 18.85 ml/min/mmHg
DLCO UNC: 19.72 ml/min/mmHg
DLCO cor % pred: 77 %
DLCO unc % pred: 81 %
FEF 25-75 PRE: 0.7 L/s
FEF 25-75 Post: 0.88 L/sec
FEF2575-%Change-Post: 26 %
FEF2575-%PRED-PRE: 28 %
FEF2575-%Pred-Post: 35 %
FEV1-%Change-Post: 8 %
FEV1-%PRED-PRE: 53 %
FEV1-%Pred-Post: 58 %
FEV1-POST: 1.47 L
FEV1-PRE: 1.36 L
FEV1FVC-%CHANGE-POST: 1 %
FEV1FVC-%Pred-Pre: 77 %
FEV6-%CHANGE-POST: 6 %
FEV6-%PRED-POST: 67 %
FEV6-%PRED-PRE: 62 %
FEV6-POST: 2.28 L
FEV6-Pre: 2.13 L
FEV6FVC-%CHANGE-POST: 0 %
FEV6FVC-%PRED-POST: 90 %
FEV6FVC-%Pred-Pre: 90 %
FVC-%Change-Post: 6 %
FVC-%Pred-Post: 73 %
FVC-%Pred-Pre: 68 %
FVC-Post: 2.3 L
FVC-Pre: 2.15 L
POST FEV6/FVC RATIO: 99 %
PRE FEV1/FVC RATIO: 63 %
Post FEV1/FVC ratio: 64 %
Pre FEV6/FVC Ratio: 99 %
RV % PRED: 184 %
RV: 3.39 L
TLC % PRED: 109 %
TLC: 5.51 L

## 2016-11-02 MED ORDER — GLYCOPYRROLATE-FORMOTEROL 9-4.8 MCG/ACT IN AERO: 2.0000 | INHALATION_SPRAY | Freq: Two times a day (BID) | RESPIRATORY_TRACT | 0 refills | Status: DC

## 2016-11-02 MED ORDER — GLYCOPYRROLATE-FORMOTEROL 9-4.8 MCG/ACT IN AERO: 2.0000 | INHALATION_SPRAY | Freq: Two times a day (BID) | RESPIRATORY_TRACT | 12 refills | Status: DC

## 2016-11-02 NOTE — Assessment & Plan Note (Addendum)
She remembers positive skin test reactions by her description for many years ago. She doesn't know if she was treated at the time. No known TB exposure. She had had prolonged cough and weight loss earlier this year but is feeling much better now and we do not have evidence of active tuberculosis. Culture efforts were hindered by change of laboratory providers but most recent cultures remain negative. [facility] did not feel bronchoscopy was indicated.

## 2016-11-02 NOTE — Progress Notes (Signed)
Subjective:    Patient ID: Sara Trevino, female    DOB: 1965-04-03, 51 y.o.   MRN: [id number]  HPI  51 year old female never smoker followed for COPD mixed type/chronic cough, allergic rhinitis, nasal polyps Allergy Vaccine-Dr. [place] Callas x 3 years w/o clear benefit CT chest 11/2013 clear lungs  CT sinus 12/2013 neg for acute process  Allergy profile 07/21/14- Total IgE 249  Elevated for dust mite. Grass pollens, tree pollens CBC w diff 07/21/14- EOS  13.6 H Allergy skin test 04/21/11 elevated dog, weed pollens, tree pollens molds  She complains primarily of coughing 5 years. Occasional white or yellow sputum. 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 Does not recognize association between symptoms and workplace Had seen Dr. Lyda Jester for second opinion and he also thought Nucala might be appropriate She was not interested in [place] because it is "too new". PFT 02/23/14 moderate obstructive airways disease, significant response to bronchodilator, air trapping, normal diffusion, normal TLC. FEV1/FVC 0.68,  She was seen at [facility] by ENT/ Dr Senior who identified nasal polyps and has scheduled a CT of sinuses and recommended bilateral endoscopic sinus surgery Quantiferon Gold TB assay Positive 2018  PFT 11/02/16-moderately severe obstructive airways disease with insignificant response to bronchodilator. Diffusion slightly reduced ----------------------------------------------------------------------------------------------  09/21/16- 51 year old female never smoker followed for COPD mixed type/cough, allergic rhinitis, nasal polyps Allergy Vaccine-Dr. [place] Callas ACUTE VISIT: productive cough-yellow in color, feels flushed, lost 10# in past few months.  Using Symbicort 160,ProAir, nebulizer budesonide No pain. Treatment in June did help for 3 weeks then cough came back. Had nasal polyp surgery June 20. Feels flushed sometimes  without fever or chills. Cough now worse again over the last 2 weeks productive yellow mucus. Continues Symbicort, occasional Proair Gradual weight loss 18 pounds over 3 years-decreased appetite SputumC&S 02/02/15-normal flora, no AFB CBC with differential 06/22/16- EOS 6.2 H,  CT chest HiRes 10/28/15    ?? On my review looks like divertic R side trachea??             . IMPRESSION: 1. No findings to suggest interstitial lung disease. 2. 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 At this time, there does not appear to be significant areas of mucoid impaction. 3. Additional incidental findings, as above.  11/02/16- 51 year old female never smoker followed for moderate persistent asthma/cough, allergic rhinitis, nasal polyps, + Quantiferon Gold Asthma; Review PFT with patient. Pt finished abx from Dr Sherene Sires and Prednisone Rx from [facility]-feels better since then. Neither Dr. Sherene Sires or the pulmonary consultant at [facility] felt that bronchoscopy would be helpful. My concern was with subtle granulomatous changes and positive QuantiFERON assay. 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 Sputum cultures were negative from 9/17.  PFT 11/02/16-moderately severe obstructive airways disease with insignificant response to bronchodilator. Diffusion slightly reduced. She never smoked but husband was a long-time smoker.  ROS-see HPI  + = positive Constitutional:    +weight loss, night sweats, fevers, chills, fatigue, lassitude. HEENT:    headaches, difficulty swallowing, tooth/dental problems, +sore throat,       +sneezing, +itching, ear ache, +nasal congestion, post nasal drip, snoring CV:    chest pain, orthopnea, PND, swelling in lower extremities, anasarca,  dizziness, palpitations Resp:   +shortness of breath with exertion or at rest.                +productive cough,   +non-productive cough, coughing up of blood.              change in color of mucus.  + wheezing.   Skin:    rash or lesions. GI:  No-   heartburn, indigestion, abdominal pain, nausea, vomiting, diarrhea,                 change in bowel habits, loss of appetite GU: dysuria, change in color of urine, no urgency or frequency.   flank pain. MS:   joint pain, +stiffness, decreased range of motion, back pain. Neuro-     nothing unusual Psych:  change in mood or affect.  depression or anxiety.   memory loss.    Objective:   Physical Exam OBJ- Physical Exam General- Alert, Oriented, Affect-appropriate, Distress- none acute, + slender Skin- rash-none, lesions- none, excoriation- none Lymphadenopathy- none Head- atraumatic            Eyes- Gross vision intact, PERRLA, conjunctivae and secretions clear            Ears- Hearing, canals-normal            Nose- + Mild turbinate edema, no-Septal dev, mucus, polyps, erosion, perforation             Throat- Mallampati II , mucosa clear , drainage- none, tonsils- atrophic Neck- flexible , trachea midline, no stridor , thyroid nl, carotid no bruit Chest - symmetrical excursion , unlabored           Heart/CV- RRR , no murmur , no gallop  , no rub, nl s1 s2                           - JVD- none , edema- none, stasis changes- none, varices- none           Lung- Clear/unlabored, cough -None wheeze- none , dullness-none, rub- none           Chest wall-  Abd-  Br/ Gen/ Rectal- Not done, not indicated Extrem- cyanosis- none, clubbing, none, atrophy- none, strength- nl Neuro- grossly intact to observatio     Assessment & Plan:

## 2016-11-02 NOTE — Patient Instructions (Signed)
Ok to get your flu shot at your work  I'm glad you feel better.  Sample and printed script for Bevespi inhaler     Inhale 2 puffs, twice daily   Try this instead of Symbicort. If you like the Bevespi, you can get the prescription filled.  If you like Symbicort better, you can go back to it.  Ok to use the albuterol rescue inhaler as before, if needed for shortness of breath, wheeze or cough :   2 puffs, every 6 hours, only as needed  Please call as needed

## 2016-11-02 NOTE — Assessment & Plan Note (Signed)
Most recent PFT fits a moderately severe obstructive airways pattern without response to bronchodilator. Her husband used to smoke. She denies significant occupational exposure to chemical fumes in her work. This might be a fixed obstructive pattern that started as an allergic asthma.  plan-try replacing Symbicort with [employer] CorporationBevespi

## 2016-11-02 NOTE — Assessment & Plan Note (Signed)
Polyps are not visible on current exam

## 2016-11-03 NOTE — Progress Notes (Signed)
PFT done.  

## 2016-11-18 LAB — FUNGUS CULTURE W SMEAR
MICRO NUMBER: 81023962
SPECIMEN QUALITY:: ADEQUATE

## 2016-11-18 LAB — MYCOBACTERIA,CULT W/FLUOROCHROME SMEAR
MICRO NUMBER: 81041251
SMEAR:: NONE SEEN
SPECIMEN QUALITY:: ADEQUATE

## 2016-11-18 LAB — TEST AUTHORIZATION

## 2016-11-18 LAB — RESPIRATORY CULTURE OR RESPIRATORY AND SPUTUM CULTURE
MICRO NUMBER:: 81041252
RESULT:: NORMAL
SPECIMEN QUALITY: ADEQUATE

## 2016-11-24 LAB — AFB CULTURE WITH SMEAR (NOT AT ARMC)
ACID FAST SMEAR: NEGATIVE
Acid Fast Culture: NEGATIVE

## 2016-12-22 ENCOUNTER — Ambulatory Visit: Payer: BLUE CROSS/BLUE SHIELD | Admitting: Internal Medicine

## 2017-01-29 ENCOUNTER — Other Ambulatory Visit: Payer: Self-pay

## 2017-01-29 DIAGNOSIS — Z1231 Encounter for screening mammogram for malignant neoplasm of breast: Secondary | ICD-10-CM

## 2017-01-31 ENCOUNTER — Other Ambulatory Visit: Payer: Self-pay

## 2017-01-31 ENCOUNTER — Other Ambulatory Visit: Payer: Self-pay | Admitting: Gastroenterology

## 2017-01-31 ENCOUNTER — Encounter (HOSPITAL_COMMUNITY): Payer: Self-pay | Admitting: Emergency Medicine

## 2017-02-12 ENCOUNTER — Other Ambulatory Visit: Payer: Self-pay | Admitting: Gastroenterology

## 2017-02-13 ENCOUNTER — Ambulatory Visit (HOSPITAL_COMMUNITY)
Admission: RE | Admit: 2017-02-13 | Discharge: 2017-02-13 | Disposition: A | Discharge status: Home / Self Care | Payer: BLUE CROSS/BLUE SHIELD | Source: Ambulatory Visit | Attending: Gastroenterology | Admitting: Gastroenterology

## 2017-02-13 ENCOUNTER — Encounter (HOSPITAL_COMMUNITY)
Admission: RE | Disposition: A | Discharge status: Home / Self Care | Payer: Self-pay | Source: Ambulatory Visit | Attending: Gastroenterology

## 2017-02-13 ENCOUNTER — Ambulatory Visit (HOSPITAL_COMMUNITY): Payer: BLUE CROSS/BLUE SHIELD | Admitting: Anesthesiology

## 2017-02-13 ENCOUNTER — Other Ambulatory Visit: Payer: Self-pay

## 2017-02-13 ENCOUNTER — Encounter (HOSPITAL_COMMUNITY): Payer: Self-pay | Admitting: *Deleted

## 2017-02-13 DIAGNOSIS — J45909 Unspecified asthma, uncomplicated: Secondary | ICD-10-CM | POA: Diagnosis not present

## 2017-02-13 DIAGNOSIS — R1013 Epigastric pain: Secondary | ICD-10-CM | POA: Insufficient documentation

## 2017-02-13 DIAGNOSIS — R634 Abnormal weight loss: Secondary | ICD-10-CM | POA: Insufficient documentation

## 2017-02-13 DIAGNOSIS — Z79899 Other long term (current) drug therapy: Secondary | ICD-10-CM | POA: Insufficient documentation

## 2017-02-13 DIAGNOSIS — K219 Gastro-esophageal reflux disease without esophagitis: Secondary | ICD-10-CM | POA: Diagnosis not present

## 2017-02-13 DIAGNOSIS — R05 Cough: Secondary | ICD-10-CM | POA: Diagnosis present

## 2017-02-13 DIAGNOSIS — K449 Diaphragmatic hernia without obstruction or gangrene: Secondary | ICD-10-CM | POA: Diagnosis not present

## 2017-02-13 HISTORY — PX: BRAVO PH STUDY: SHX5421

## 2017-02-13 HISTORY — PX: ESOPHAGOGASTRODUODENOSCOPY (EGD) WITH PROPOFOL: SHX5813

## 2017-02-13 LAB — PREGNANCY, URINE: Preg Test, Ur: NEGATIVE

## 2017-02-13 SURGERY — ESOPHAGOGASTRODUODENOSCOPY (EGD) WITH PROPOFOL
Anesthesia: Monitor Anesthesia Care

## 2017-02-13 MED ORDER — PROPOFOL 500 MG/50ML IV EMUL
INTRAVENOUS | Status: DC | PRN
  Administered 2017-02-13: 125 ug/kg/min via INTRAVENOUS

## 2017-02-13 MED ORDER — LACTATED RINGERS IV SOLN: INTRAVENOUS | Status: DC

## 2017-02-13 MED ORDER — PROPOFOL 500 MG/50ML IV EMUL
INTRAVENOUS | Status: DC | PRN
  Administered 2017-02-13 (×2): 30 mg via INTRAVENOUS

## 2017-02-13 MED ORDER — SODIUM CHLORIDE 0.9 % IV SOLN: INTRAVENOUS | Status: DC

## 2017-02-13 MED ORDER — LACTATED RINGERS IV SOLN
INTRAVENOUS | Status: DC
  Administered 2017-02-13: 12:00:00 via INTRAVENOUS

## 2017-02-13 MED ORDER — PROPOFOL 10 MG/ML IV BOLUS
INTRAVENOUS | Status: AC
  Filled 2017-02-13: qty 40

## 2017-02-13 SURGICAL SUPPLY — 14 items

## 2017-02-13 NOTE — Anesthesia Preprocedure Evaluation (Signed)
Anesthesia Evaluation  Patient identified by MRN, date of birth, ID band Patient awake    Reviewed: Allergy & Precautions, NPO status , Patient's Chart, lab work & pertinent test results  Airway Mallampati: II  TM Distance: >3 FB Neck ROM: Full    Dental no notable dental hx.    Pulmonary neg pulmonary ROS,    Pulmonary exam normal breath sounds clear to auscultation       Cardiovascular negative cardio ROS Normal cardiovascular exam Rhythm:Regular Rate:Normal     Neuro/Psych negative neurological ROS  negative psych ROS   GI/Hepatic negative GI ROS, Neg liver ROS,   Endo/Other  negative endocrine ROS  Renal/GU negative Renal ROS  negative genitourinary   Musculoskeletal negative musculoskeletal ROS (+)   Abdominal   Peds  Hematology negative hematology ROS (+)   Anesthesia Other Findings   Reproductive/Obstetrics                             Anesthesia Physical Anesthesia Plan  ASA: II  Anesthesia Plan: MAC   Post-op Pain Management:    Induction: Intravenous  PONV Risk Score and Plan:   Airway Management Planned: Mask, Natural Airway and Nasal Cannula  Additional Equipment:   Intra-op Plan:   Post-operative Plan:   Informed Consent: I have reviewed the patients History and Physical, chart, labs and discussed the procedure including the risks, benefits and alternatives for the proposed anesthesia with the patient or authorized representative who has indicated his/her understanding and acceptance.   Dental advisory given  Plan Discussed with: CRNA  Anesthesia Plan Comments:         Anesthesia Quick Evaluation

## 2017-02-13 NOTE — Anesthesia Postprocedure Evaluation (Signed)
Anesthesia Post Note  Patient: Sara Trevino  Procedure(s) Performed: ESOPHAGOGASTRODUODENOSCOPY (EGD) WITH PROPOFOL (N/A ) BRAVO PH STUDY (N/A )     Patient location during evaluation: Endoscopy Anesthesia Type: MAC Level of consciousness: awake and alert Pain management: pain level controlled Vital Signs Assessment: post-procedure vital signs reviewed and stable Respiratory status: spontaneous breathing, nonlabored ventilation, respiratory function stable and patient connected to nasal cannula oxygen Cardiovascular status: stable and blood pressure returned to baseline Postop Assessment: no apparent nausea or vomiting Anesthetic complications: no    Last Vitals:  Vitals:   02/13/17 1420 02/13/17 1430  BP: 119/66 119/74  Pulse: 71 74  Resp: (!) 21 17  Temp:    SpO2: 94% 95%    Last Pain:  Vitals:   02/13/17 1407  TempSrc: Oral                 Barnet Glasgow

## 2017-02-13 NOTE — Discharge Instructions (Signed)
YOU HAD AN ENDOSCOPIC PROCEDURE TODAY: Refer to the procedure report and other information in the discharge instructions given to you for any specific questions about what was found during the examination. If this information does not answer your questions, please call [facility] office at [phone] to clarify.   YOU SHOULD EXPECT: Some feelings of bloating in the abdomen. Passage of more gas than usual. Walking can help get rid of the air that was put into your GI tract during the procedure and reduce the bloating. If you had a lower endoscopy (such as a colonoscopy or flexible sigmoidoscopy) you may notice spotting of blood in your stool or on the toilet paper. Some abdominal soreness may be present for a day or two, also.  DIET: Your first meal following the procedure should be a light meal and then it is ok to progress to your normal diet. A half-sandwich or bowl of soup is an example of a good first meal. Heavy or fried foods are harder to digest and may make you feel nauseous or bloated. Drink plenty of fluids but you should avoid alcoholic beverages for 24 hours. If you had a esophageal dilation, please see attached instructions for diet.   ACTIVITY: Your care partner should take you home directly after the procedure. You should plan to take it easy, moving slowly for the rest of the day. You can resume normal activity the day after the procedure however YOU SHOULD NOT DRIVE, use power tools, machinery or perform tasks that involve climbing or major physical exertion for 24 hours (because of the sedation medicines used during the test).   SYMPTOMS TO REPORT IMMEDIATELY: A gastroenterologist can be reached at any hour. Please call [phone]  for any of the following symptoms:  Following upper endoscopy (EGD, EUS, ERCP, esophageal dilation) Vomiting of blood or coffee ground material  New, significant abdominal pain  New, significant chest pain or pain under the shoulder blades  Painful or  persistently difficult swallowing  New shortness of breath  Black, tarry-looking or red, bloody stools  FOLLOW UP:  If any biopsies were taken you will be contacted by phone or by letter within the next 1-3 weeks. Call [phone]  if you have not heard about the biopsies in 3 weeks.  Please also call with any specific questions about appointments or follow up tests.  Call if question or problem otherwise call for results in one week and follow-up as needed and soft solid diet for2 days and particularly call if severe abdominal pain with nausea vomiting or fever and will need an x-ray if MRI is needed in the next 2-3 weeks prior to proceeding

## 2017-02-13 NOTE — Transfer of Care (Signed)
Immediate Anesthesia Transfer of Care Note  Patient: Sara Trevino  Procedure(s) Performed: ESOPHAGOGASTRODUODENOSCOPY (EGD) WITH PROPOFOL (N/A ) BRAVO PH STUDY (N/A )  Patient Location: PACU  Anesthesia Type:MAC  Level of Consciousness: sedated, patient cooperative and responds to stimulation  Airway & Oxygen Therapy: Patient Spontanous Breathing and Patient connected to nasal cannula oxygen  Post-op Assessment: Report given to RN and Post -op Vital signs reviewed and stable  Post vital signs: Reviewed and stable  Last Vitals:  Vitals:   02/13/17 1205  BP: (!) 146/79  Pulse: 71  Resp: 17  Temp: 36.6 C  SpO2: 97%    Last Pain:  Vitals:   02/13/17 1205  TempSrc: Oral         Complications: No apparent anesthesia complications

## 2017-02-13 NOTE — Progress Notes (Signed)
Sara Trevino 1:04 PM  Subjective: Patient without any problems since she was seen recently in the office and we rediscussed her procedure including the bravo capsule and passing it  Objective: Vital signs stable afebrile no acute distress exam please see preassessment evaluation  Assessment: Questionable reflux in a patient with chronic cough weight loss and midepigastric abdominal pain  Plan: Okay to proceed with endoscopy probable problem with anesthesia assistance  [facility],Kewon Statler E  Pager [phone] After 5PM or if no answer call [phone]

## 2017-02-13 NOTE — Op Note (Signed)
[facility] Patient Name: Sara Trevino Procedure Date: 02/13/2017 MRN: [id number] Attending MD: Vida Rigger , MD Date of Birth: 11/30/1965 CSN: [id number] Age: 52 Admit Type: Outpatient Procedure:                Upper GI endoscopy Indications:              Exclusion of esophageal reflux, Chronic cough Providers:                Vida Rigger, MD, Dwain Sarna, RN, Kandice Robinsons,                            Technician, Madalyn Rob, Technician Referring MD:              Medicines:                Propofol total dose 210 mg IV Complications:            No immediate complications. Estimated Blood Loss:     Estimated blood loss: none. Estimated blood loss:                            none. Procedure:                Pre-Anesthesia Assessment:                           - Prior to the procedure, a History and Physical                            was performed, and patient medications and                            allergies were reviewed. The patient's tolerance of                            previous anesthesia was also reviewed. The risks                            and benefits of the procedure and the sedation                            options and risks were discussed with the patient.                            All questions were answered, and informed consent                            was obtained. Prior Anticoagulants: The patient has                            taken no previous anticoagulant or antiplatelet                            agents. ASA Grade Assessment: I - A normal, healthy  patient. After reviewing the risks and benefits,                            the patient was deemed in satisfactory condition to                            undergo the procedure.                           After obtaining informed consent, the endoscope was                            passed under direct vision. Throughout the                            procedure,  the patient's blood pressure, pulse, and                            oxygen saturations were monitored continuously. The                            EG-[id number] ([id number]) scope was introduced through the                            mouth, and advanced to the second part of duodenum.                            The upper GI endoscopy was accomplished without                            difficulty. The patient tolerated the procedure                            well. Scope In: Scope Out: Findings:      The larynx was normal.      A small hiatal hernia was present.      The entire examined stomach was normal.      The duodenal bulb, first portion of the duodenum and second portion of       the duodenum were normal.      The exam was otherwise without abnormality.      The examined esophagus was normal. The BRAVO capsule with delivery       system was introduced through the mouth and advanced into the esophagus,       such that the BRAVO pH capsule was positioned 34 cm from the incisors,       which was 6 cm proximal to the GE junction. The BRAVO pH capsule was       then deployed and attached to the esophageal mucosa. The delivery system       was then withdrawn. Endoscopy was utilized for probe placement and       diagnostic evaluation. Impression:               - Normal larynx.                           -  Small hiatal hernia.                           - Normal stomach.                           - Normal duodenal bulb, first portion of the                            duodenum and second portion of the duodenum.                           - The examination was otherwise normal.                           - Normal esophagus.                           - The BRAVO pH capsule was deployed.                           - No specimens collected. Moderate Sedation:      N/A- Per Anesthesia Care Recommendation:           - Patient has a contact number available for                            emergencies.  The signs and symptoms of potential                            delayed complications were discussed with the                            patient. Return to normal activities tomorrow.                            Written discharge instructions were provided to the                            patient.                           - Soft diet for 2 days.                           - Continue present medications.                           - Return to GI clinic PRN.                           - Telephone GI clinic if symptomatic PRN. Procedure Code(s):        --- Professional ---                           [id number], Esophagogastroduodenoscopy, flexible,  transoral; diagnostic, including collection of                            specimen(s) by brushing or washing, when performed                            (separate procedure) Diagnosis Code(s):        --- Professional ---                           K44.9, Diaphragmatic hernia without obstruction or                            gangrene                           R05, Cough CPT copyright 2016 American Medical Association. All rights reserved. The codes documented in this report are preliminary and upon coder review may  be revised to meet current compliance requirements. Vida Rigger, MD 02/13/2017 2:11:05 PM This report has been signed electronically. Number of Addenda: 0

## 2017-02-14 ENCOUNTER — Encounter (HOSPITAL_COMMUNITY): Payer: Self-pay | Admitting: Gastroenterology

## 2017-03-05 ENCOUNTER — Ambulatory Visit: Payer: BLUE CROSS/BLUE SHIELD | Admitting: Internal Medicine

## 2017-03-07 ENCOUNTER — Ambulatory Visit: Payer: BLUE CROSS/BLUE SHIELD

## 2017-03-08 ENCOUNTER — Other Ambulatory Visit: Payer: Self-pay | Admitting: Internal Medicine

## 2017-03-08 ENCOUNTER — Telehealth: Payer: Self-pay | Admitting: Internal Medicine

## 2017-03-09 ENCOUNTER — Telehealth: Payer: Self-pay | Admitting: Internal Medicine

## 2017-03-09 MED ORDER — EPINEPHRINE 0.3 MG/0.3ML IJ SOAJ: 0.3000 mg | Freq: Once | INTRAMUSCULAR | 2 refills | Status: AC

## 2017-03-09 NOTE — Telephone Encounter (Signed)
Called and spoke with pt verifying the pharmacy to send med to.  Rx sent to pt's pharmacy. Nothing further needed at this current time.

## 2017-03-09 NOTE — Telephone Encounter (Signed)
Error

## 2017-05-21 ENCOUNTER — Encounter: Payer: Self-pay | Admitting: Internal Medicine

## 2017-05-21 ENCOUNTER — Ambulatory Visit
Admission: RE | Admit: 2017-05-21 | Discharge: 2017-05-21 | Disposition: A | Discharge status: Home / Self Care | Payer: BLUE CROSS/BLUE SHIELD | Source: Ambulatory Visit

## 2017-05-21 ENCOUNTER — Ambulatory Visit: Payer: BLUE CROSS/BLUE SHIELD | Admitting: Internal Medicine

## 2017-05-21 ENCOUNTER — Other Ambulatory Visit (INDEPENDENT_AMBULATORY_CARE_PROVIDER_SITE_OTHER): Payer: BLUE CROSS/BLUE SHIELD

## 2017-05-21 VITALS — BP 98/60 | HR 66 | Ht 64.0 in | Wt 98.4 lb

## 2017-05-21 DIAGNOSIS — J4540 Moderate persistent asthma, uncomplicated: Secondary | ICD-10-CM

## 2017-05-21 DIAGNOSIS — R9389 Abnormal findings on diagnostic imaging of other specified body structures: Secondary | ICD-10-CM | POA: Diagnosis not present

## 2017-05-21 DIAGNOSIS — J45991 Cough variant asthma: Secondary | ICD-10-CM

## 2017-05-21 DIAGNOSIS — Z1231 Encounter for screening mammogram for malignant neoplasm of breast: Secondary | ICD-10-CM

## 2017-05-21 LAB — CBC WITH DIFFERENTIAL/PLATELET
BASOS PCT: 0.6 % (ref 0.0–3.0)
Basophils Absolute: 0 10*3/uL (ref 0.0–0.1)
EOS PCT: 13 % — AB (ref 0.0–5.0)
Eosinophils Absolute: 0.6 10*3/uL (ref 0.0–0.7)
HCT: 41 % (ref 36.0–46.0)
Hemoglobin: 13.8 g/dL (ref 12.0–15.0)
LYMPHS ABS: 1.4 10*3/uL (ref 0.7–4.0)
Lymphocytes Relative: 29.3 % (ref 12.0–46.0)
MCHC: 33.6 g/dL (ref 30.0–36.0)
MCV: 94.6 fl (ref 78.0–100.0)
MONO ABS: 0.3 10*3/uL (ref 0.1–1.0)
Monocytes Relative: 7.2 % (ref 3.0–12.0)
NEUTROS ABS: 2.3 10*3/uL (ref 1.4–7.7)
NEUTROS PCT: 49.9 % (ref 43.0–77.0)
PLATELETS: 162 10*3/uL (ref 150.0–400.0)
RBC: 4.34 Mil/uL (ref 3.87–5.11)
RDW: 13.8 % (ref 11.5–15.5)
WBC: 4.7 10*3/uL (ref 4.0–10.5)

## 2017-05-21 MED ORDER — FLUTICASONE-UMECLIDIN-VILANT 100-62.5-25 MCG/INH IN AEPB: 1.0000 | INHALATION_SPRAY | Freq: Every day | RESPIRATORY_TRACT | 0 refills | Status: DC

## 2017-05-21 NOTE — Patient Instructions (Signed)
Sample X 2 Trelegy inhaler     Inhale 1 puff, then rinse mouth, once daily.   Try this instead of the Symbicort now. See if it works better.  Order- lab- CBC w diff, total IgE     Dx allergic asthma moderate persistent  With these test results we can make a final decision about starting either Nucala or Xolair, as suggested also by the doctors at [facility].

## 2017-05-21 NOTE — Progress Notes (Signed)
Subjective:    Patient ID: Sara Trevino, female    DOB: 04/12/1965, 52 y.o.   MRN: [id number]  HPI  52 year old female never smoker followed for COPD mixed type/chronic cough, allergic rhinitis, nasal polyps Allergy Vaccine-Dr. Bellmawr Callas x 3 years w/o clear benefit CT chest 11/2013 clear lungs  CT sinus 12/2013 neg for acute process  Allergy profile 07/21/14- Total IgE 249  Elevated for dust mite. Grass pollens, tree pollens CBC w diff 07/21/14- EOS  13.6 H Allergy skin test 04/21/11 elevated dog, weed pollens, tree pollens molds  She complains primarily of coughing 5 years. Occasional white or yellow sputum. 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 Does not recognize association between symptoms and workplace Had seen Dr. Lyda Jester for second opinion and he also thought Nucala might be appropriate She was not interested in [place] because it is "too new". PFT 02/23/14 moderate obstructive airways disease, significant response to bronchodilator, air trapping, normal diffusion, normal TLC. FEV1/FVC 0.68,  She was seen at [facility] by ENT/ Dr Senior who identified nasal polyps and has scheduled a CT of sinuses and recommended bilateral endoscopic sinus surgery Quantiferon Gold TB assay Positive 2018  PFT 11/02/16-moderately severe obstructive airways disease with insignificant response to bronchodilator. Diffusion slightly reduced ---------------------------------------------------------------------------------------------- .  11/02/16- 52 year old female never smoker followed for moderate persistent asthma/cough, allergic rhinitis, nasal polyps, + Quantiferon Gold Asthma; Review PFT with patient. Pt finished abx from Dr Sherene Sires and Prednisone Rx from [facility]-feels better since then. Neither Dr. Sherene Sires or the pulmonary consultant at [facility] felt that bronchoscopy would be helpful. My concern was  with subtle granulomatous changes and positive QuantiFERON assay. 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 Sputum cultures were negative from 9/17.  PFT 11/02/16-moderately severe obstructive airways disease with insignificant response to bronchodilator. Diffusion slightly reduced. She never smoked but husband was a long-time smoker.  05/21/2017- 52 year old female never smoker( + second-hand) followed for / AsthmaCOPD mixed type, allergic rhinitis, nasal polyps, + Quantiferon Gold/ culture Neg ----Asthma; Pt states she went to [place] from 2-19 through 04-27-17-started having cough while there; was given prednisone and abx in [place]. about 2-3 weeks ago seen her PCP-was given neb tx for wheezing. Pt states the cough is still there.  Upper endoscopy 02/13/2017- ? Neg ++++Seen at [facility] 03/01/2017 for chronic cough: "Sara Trevino is a 52 y.o. female with allergic rhinitis who presented for evaluation of chronic cough. She has persistentcough with yellow mucus for 6 years, likely due to severe persistent asthma (with mild eosinophilia and IgE elevation) vs RADS (sudden onset after changing her job, esp that she is exposed to chemicals/pesticides).  Her chronic cough is associated with severe, progressive obstructive lung disease with significant loss of FEV1. She is a life-long non-smoker with no exposure history to suggest COPD. ANCA was negative which makes vasculitis (ie., EGPA /Churg-Strauss) unlikely although doesn't rule it out, esp. with nasal polyps and eosinophilia. Also, doesn't have enough criteria for ABPA (neg IgE Aspergillus specific precipitins) and total IgE is mildly elevated although this is difficult to interpret in light of frequent prednisone bursts. NTM is another consideration although absence of nodules etc on CT and negative AFB speak against it. If these are excluded, may have to r/o other systemic diseases (esp. Sarcoidosis)  that could respond to steroids. Bronchoscopy is of limited value at this time.  For now, will continue to treat for severe persistent asthma. Based on elevated IgE and eosinophilia, she may also be a candidate for xolair or nucala.  Recommendations: -- Continue Symbicort (to use twice daily) and PRN albuterol -- Add Spiriva and Singulair -- Would consider stopping airway clearance as it may worsen asthma  -- Will reach out to her pulmonologist re: biologics -- She expressed her wishes to follow with local pulmonologist in [facility] " ++++ She is willing to retry Trelegy and to consider a biologic-we discussed Nucala versus Xolair Patient seen in the office today and instructed on use of Trelegy.  Patient expressed understanding and demonstrated technique. Katie Welchel,CMA   ROS-see HPI  + = positive Constitutional:    +weight loss, night sweats, fevers, chills, fatigue, lassitude. HEENT:    headaches, difficulty swallowing, tooth/dental problems, +sore throat,       +sneezing, +itching, ear ache, +nasal congestion, post nasal drip, snoring CV:    chest pain, orthopnea, PND, swelling in lower extremities, anasarca,                                                     dizziness, palpitations Resp:   +shortness of breath with exertion or at rest.                +productive cough,   +non-productive cough, coughing up of blood.              change in color of mucus.  + wheezing.   Skin:    rash or lesions. GI:  No-   heartburn, indigestion, abdominal pain, nausea, vomiting, diarrhea,                 change in bowel habits, loss of appetite GU: dysuria, change in color of urine, no urgency or frequency.   flank pain. MS:   joint pain, +stiffness, decreased range of motion, back pain. Neuro-     nothing unusual Psych:  change in mood or affect.  depression or anxiety.   memory loss.    Objective:   Physical Exam OBJ- Physical Exam General- Alert, Oriented, Affect-appropriate, Distress- none  acute, + slender Skin- rash-none, lesions- none, excoriation- none Lymphadenopathy- none Head- atraumatic            Eyes- Gross vision intact, PERRLA, conjunctivae and secretions clear            Ears- Hearing, canals-normal            Nose- + Mild turbinate edema, no-Septal dev, mucus, polyps, erosion, perforation             Throat- Mallampati II , mucosa clear , drainage- none, tonsils- atrophic Neck- flexible , trachea midline, no stridor , thyroid nl, carotid no bruit Chest - symmetrical excursion , unlabored           Heart/CV- RRR , no murmur , no gallop  , no rub, nl s1 s2                           - JVD- none , edema- none, stasis changes- none, varices- none           Lung- Clear/unlabored, cough -None wheeze- none , dullness-none, rub- none           Chest  wall-  Abd-  Br/ Gen/ Rectal- Not done, not indicated Extrem- cyanosis- none, clubbing, none, atrophy- none, strength- nl Neuro- grossly intact to observation     Assessment & Plan:

## 2017-05-22 LAB — IGE: IGE (IMMUNOGLOBULIN E), SERUM: 509 kU/L — AB (ref <=114)

## 2017-05-23 NOTE — Assessment & Plan Note (Addendum)
Imaging had been read here as suggestive of very early atypical infection but that assessment was not shared on review at [facility].  We will continue to watch.

## 2017-05-23 NOTE — Assessment & Plan Note (Signed)
We are going to retry Trelegy.  Also rechecking IgE and eosinophil level, recognizing she had an injection of Depo-Medrol about 2 weeks ago so eosinophil count may be suppressed.  With these results I again suggested we try a Biologic.  This is the same advice she got from me previously, from Dr Susa Simmonds, and from her pulmonologist second opinion at [facility].

## 2017-06-28 ENCOUNTER — Telehealth: Payer: Self-pay | Admitting: Internal Medicine

## 2017-06-28 MED ORDER — BUDESONIDE-FORMOTEROL FUMARATE 80-4.5 MCG/ACT IN AERO: 2.0000 | INHALATION_SPRAY | Freq: Two times a day (BID) | RESPIRATORY_TRACT | 11 refills | Status: DC

## 2017-06-28 NOTE — Telephone Encounter (Signed)
Spoke with pt and advised rx sent to pharmacy. Nothing further is needed.   Patient Instructions by Waymon BudgeYoung, Clinton D, MD at 05/21/2017 3:30 PM  Author: Waymon BudgeYoung, Clinton D, MD Author Type: Physician Filed: 05/21/2017 3:17 PM  Note Status: Signed Cosign: Cosign Not Required Encounter Date: 05/21/2017  Editor: Waymon BudgeYoung, Clinton D, MD (Physician)    Sample X 2 Trelegy inhaler     Inhale 1 puff, then rinse mouth, once daily.   Try this instead of the Symbicort now. See if it works better.  Order- lab- CBC w diff, total IgE     Dx allergic asthma moderate persistent  With these test results we can make a final decision about starting either Nucala or Xolair, as [facility]

## 2017-08-23 ENCOUNTER — Ambulatory Visit: Payer: BLUE CROSS/BLUE SHIELD | Admitting: Internal Medicine

## 2017-09-19 ENCOUNTER — Ambulatory Visit: Payer: BLUE CROSS/BLUE SHIELD | Admitting: Primary Care

## 2017-09-25 ENCOUNTER — Telehealth: Payer: Self-pay | Admitting: Internal Medicine

## 2017-09-25 ENCOUNTER — Ambulatory Visit: Payer: BLUE CROSS/BLUE SHIELD | Admitting: Internal Medicine

## 2017-09-25 ENCOUNTER — Encounter: Payer: Self-pay | Admitting: Internal Medicine

## 2017-09-25 DIAGNOSIS — R7612 Nonspecific reaction to cell mediated immunity measurement of gamma interferon antigen response without active tuberculosis: Secondary | ICD-10-CM

## 2017-09-25 DIAGNOSIS — R059 Cough, unspecified: Secondary | ICD-10-CM

## 2017-09-25 DIAGNOSIS — R05 Cough: Secondary | ICD-10-CM

## 2017-09-25 DIAGNOSIS — J4541 Moderate persistent asthma with (acute) exacerbation: Secondary | ICD-10-CM | POA: Diagnosis not present

## 2017-09-25 MED ORDER — AZITHROMYCIN 250 MG PO TABS: ORAL_TABLET | ORAL | 0 refills | Status: DC

## 2017-09-25 MED ORDER — PREDNISONE 10 MG PO TABS: ORAL_TABLET | ORAL | 0 refills | Status: DC

## 2017-09-25 NOTE — Patient Instructions (Signed)
Order- CT  chest High Res     Dx chronic cough, MAC, weight loss  We will start Xolair when my nurse gets back  Scripts sent for Zpak antibiotic and prednisone taper  Please call as needed

## 2017-09-25 NOTE — Telephone Encounter (Signed)
Patient was seen by CY today for an acute visit. He and the patient discussed starting Xolair. Patient wishes to start Xolair in October 2019. CY wishes for me to route this message to [place] as a FYI.

## 2017-09-25 NOTE — Progress Notes (Signed)
Subjective:    Patient ID: Sara Trevino, female    DOB: 1965-05-20, 52 y.o.   MRN: [id number]  HPI  52 year old female never smoker followed for COPD mixed type/chronic cough, allergic rhinitis, nasal polyps Allergy Vaccine-Dr. Tupelo Callas x 3 years w/o clear benefit CT chest 11/2013 clear lungs  CT sinus 12/2013 neg for acute process  Allergy profile 07/21/14- Total IgE 249  Elevated for dust mite. Grass pollens, tree pollens CBC w diff 07/21/14- EOS  13.6 H Allergy skin test 04/21/11 elevated dog, weed pollens, tree pollens molds  She complains primarily of coughing 5 years. Occasional white or yellow sputum. 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 Does not recognize association between symptoms and workplace Had seen Dr. Lyda Jester for second opinion and he also thought Nucala might be appropriate She was not interested in [place] because it is "too new". PFT 02/23/14 moderate obstructive airways disease, significant response to bronchodilator, air trapping, normal diffusion, normal TLC. FEV1/FVC 0.68,  She was seen at [facility] by ENT/ Dr Senior who identified nasal polyps and has scheduled a CT of sinuses and recommended bilateral endoscopic sinus surgery Quantiferon Gold TB assay Positive 2018  PFT 11/02/16-moderately severe obstructive airways disease with insignificant response to bronchodilator. Diffusion slightly reduced ----------------------------------------------------------------------------------------------r.  05/21/2017- 52 year old female never smoker( + second-hand) followed for / AsthmaCOPD mixed type, allergic rhinitis, nasal polyps, + Quantiferon Gold/ culture Neg ----Asthma; Pt states she went to [place] from 2-19 through 04-27-17-started having cough while there; was given prednisone and abx in [place]. about 2-3 weeks ago seen her PCP-was given neb tx for wheezing. Pt states the cough is still  there.  Upper endoscopy 02/13/2017- ? Neg ++++Seen at [facility] pulmonary 03/01/2017 for chronic cough: "Sara Trevino is a 52 y.o. female with allergic rhinitis who presented for evaluation of chronic cough. She has persistentcough with yellow mucus for 6 years, likely due to severe persistent asthma (with mild eosinophilia and IgE elevation) vs RADS (sudden onset after changing her job, esp that she is exposed to chemicals/pesticides).  Her chronic cough is associated with severe, progressive obstructive lung disease with significant loss of FEV1. She is a life-long non-smoker with no exposure history to suggest COPD. ANCA was negative which makes vasculitis (ie., EGPA /Churg-Strauss) unlikely although doesn't rule it out, esp. with nasal polyps and eosinophilia. Also, doesn't have enough criteria for ABPA (neg IgE Aspergillus specific precipitins) and total IgE is mildly elevated although this is difficult to interpret in light of frequent prednisone bursts. NTM is another consideration although absence of nodules etc on CT and negative AFB speak against it. If these are excluded, may have to r/o other systemic diseases (esp. Sarcoidosis) that could respond to steroids. Bronchoscopy is of limited value at this time. For now, will continue to treat for severe persistent asthma. Based on elevated IgE and eosinophilia, she may also be a candidate for xolair or nucala.  Recommendations: -- Continue Symbicort (to use twice daily) and PRN albuterol -- Add Spiriva and Singulair -- Would consider stopping airway clearance as it may worsen asthma  -- Will reach out to her pulmonologist re: biologics -- She expressed her wishes to follow with local pulmonologist in [facility] " ++++ She is willing to retry Trelegy and to consider a biologic-we discussed Nucala versus Xolair Patient seen in the office today and instructed on use of Trelegy.  Patient expressed understanding and demonstrated technique.  Sara Trevino   09/25/2017- 52 year old female never smoker( + second-hand) followed for / AsthmaCOPD mixed type, chronic cough, allergic rhinitis, nasal polyps, + Quantiferon Gold/ culture Neg -----For the past week, she has had a productive cough with yellow phlegm. Chills and body aches. Increased SOB and fatigue. Denies any issues with nasal congestion.  Lab 05/21/2017-  EOS 13%, IgE 509 Pro-air HFA, Symbicort 80 1 week exacerbation of cough-already on day 3/7 of cephalexin.  Intermittent prednisone and antibiotics this year which we reviewed.  Daily cough is now worse.  ROS-see HPI  + = positive Constitutional:    +weight loss, night sweats, fevers, chills, fatigue, lassitude. HEENT:    headaches, difficulty swallowing, tooth/dental problems, +sore throat,       +sneezing, +itching, ear ache, +nasal congestion, post nasal drip, snoring CV:    chest pain, orthopnea, PND, swelling in lower extremities, anasarca,                                                     dizziness, palpitations Resp:   +shortness of breath with exertion or at rest.                +productive cough,   +non-productive cough, coughing up of blood.              change in color of mucus.  + wheezing.   Skin:    rash or lesions. GI:  No-   heartburn, indigestion, abdominal pain, nausea, vomiting, diarrhea,                 change in bowel habits, loss of appetite GU: dysuria, change in color of urine, no urgency or frequency.   flank pain. MS:   joint pain, +stiffness, decreased range of motion, back pain. Neuro-     nothing unusual Psych:  change in mood or affect.  depression or anxiety.   memory loss.    Objective:   Physical Exam OBJ- Physical Exam General- Alert, Oriented, Affect-appropriate, Distress- none acute, + slender Skin- rash-none, lesions- none, excoriation- none Lymphadenopathy- none Head- atraumatic            Eyes- Gross vision intact, PERRLA, conjunctivae and secretions clear            Ears-  Hearing, canals-normal            Nose- + Mild turbinate edema, no-Septal dev, mucus, polyps, erosion, perforation             Throat- Mallampati II , mucosa clear , drainage- none, tonsils- atrophic Neck- flexible , trachea midline, no stridor , thyroid nl, carotid no bruit Chest - symmetrical excursion , unlabored           Heart/CV- RRR , no murmur , no gallop  , no rub, nl s1 s2                           - JVD- none , edema- none, stasis changes- none, varices- none           Lung- Clear/unlabored, cough +, wheeze+, dullness-none, rub- none           Chest wall-  Abd-  Br/ Gen/ Rectal- Not done, not indicated Extrem- cyanosis- none, clubbing, none, atrophy- none, strength- nl Neuro- grossly intact  to observation     Assessment & Plan:

## 2017-09-26 NOTE — Telephone Encounter (Signed)
Sara Trevino please call patient to go over protocol for Xolair and have her sign forms to start process. Thanks.

## 2017-10-01 NOTE — Telephone Encounter (Addendum)
Will do. Called lmomtcb. I called her again today mailbox is full.

## 2017-10-16 ENCOUNTER — Ambulatory Visit (INDEPENDENT_AMBULATORY_CARE_PROVIDER_SITE_OTHER)
Admission: RE | Admit: 2017-10-16 | Discharge: 2017-10-16 | Disposition: A | Discharge status: Home / Self Care | Payer: BLUE CROSS/BLUE SHIELD | Source: Ambulatory Visit | Attending: Internal Medicine | Admitting: Internal Medicine

## 2017-10-16 DIAGNOSIS — R05 Cough: Secondary | ICD-10-CM | POA: Diagnosis not present

## 2017-10-16 DIAGNOSIS — R059 Cough, unspecified: Secondary | ICD-10-CM

## 2017-10-25 NOTE — Telephone Encounter (Signed)
Spoke with patient-states she is traveling this month for work and will call us back to start process for Xolair in November. Will forward to CY as FYI.

## 2017-10-29 NOTE — Assessment & Plan Note (Signed)
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 Plan-CT chest with high resolution looking for evidence of bronchitis/bronchiectasis/progressive typical infection.

## 2017-10-29 NOTE — Assessment & Plan Note (Signed)
Exacerbation of asthmatic bronchitis probably with an acute viral pattern upper respiratory infection.  This is a recurrent pattern for her with strong allergic markers in the background including nasal polyps, eosinophilia, elevated IgE. Plan-suggest Z-Pak and prednisone.  We discussed again trial of a biologic like Xolair.

## 2017-11-19 ENCOUNTER — Ambulatory Visit: Payer: BLUE CROSS/BLUE SHIELD | Admitting: Internal Medicine

## 2017-12-27 ENCOUNTER — Ambulatory Visit: Payer: BLUE CROSS/BLUE SHIELD | Admitting: Internal Medicine

## 2018-02-04 ENCOUNTER — Encounter: Payer: Self-pay | Admitting: Internal Medicine

## 2018-02-04 ENCOUNTER — Ambulatory Visit: Payer: BLUE CROSS/BLUE SHIELD | Admitting: Internal Medicine

## 2018-02-04 VITALS — BP 116/68 | HR 87 | Ht 64.0 in | Wt 101.8 lb

## 2018-02-04 DIAGNOSIS — J449 Chronic obstructive pulmonary disease, unspecified: Secondary | ICD-10-CM | POA: Diagnosis not present

## 2018-02-04 DIAGNOSIS — J4541 Moderate persistent asthma with (acute) exacerbation: Secondary | ICD-10-CM | POA: Diagnosis not present

## 2018-02-04 LAB — CBC WITH DIFFERENTIAL/PLATELET
BASOS ABS: 0.1 10*3/uL (ref 0.0–0.1)
Basophils Relative: 1.4 % (ref 0.0–3.0)
Eosinophils Absolute: 0.4 10*3/uL (ref 0.0–0.7)
Eosinophils Relative: 9.6 % — ABNORMAL HIGH (ref 0.0–5.0)
HEMATOCRIT: 41.2 % (ref 36.0–46.0)
HEMOGLOBIN: 13.7 g/dL (ref 12.0–15.0)
LYMPHS PCT: 26.8 % (ref 12.0–46.0)
Lymphs Abs: 1.2 10*3/uL (ref 0.7–4.0)
MCHC: 33.3 g/dL (ref 30.0–36.0)
MCV: 96 fl (ref 78.0–100.0)
MONOS PCT: 7.1 % (ref 3.0–12.0)
Monocytes Absolute: 0.3 10*3/uL (ref 0.1–1.0)
NEUTROS ABS: 2.5 10*3/uL (ref 1.4–7.7)
Neutrophils Relative %: 55.1 % (ref 43.0–77.0)
Platelets: 181 10*3/uL (ref 150.0–400.0)
RBC: 4.29 Mil/uL (ref 3.87–5.11)
RDW: 13.5 % (ref 11.5–15.5)
WBC: 4.6 10*3/uL (ref 4.0–10.5)

## 2018-02-04 MED ORDER — METHYLPREDNISOLONE ACETATE 80 MG/ML IJ SUSP
80.0000 mg | Freq: Once | INTRAMUSCULAR | Status: AC
  Administered 2018-02-04: 80 mg via INTRAMUSCULAR

## 2018-02-04 MED ORDER — LEVALBUTEROL HCL 0.63 MG/3ML IN NEBU
0.6300 mg | INHALATION_SOLUTION | Freq: Once | RESPIRATORY_TRACT | Status: AC
  Administered 2018-02-04: 0.63 mg via RESPIRATORY_TRACT

## 2018-02-04 NOTE — Assessment & Plan Note (Signed)
Imaging has continued to suggest an inflammatory bronchiolitis consistent with [facility].  Labs have been significant for allergy markers with elevated IgE, eosinophils, but not granulocytes.  We discussed again the possibility of bronchoscopy for a deeper sample and possibly transbronchial biopsies.  We will do labs checking updated status but also looking for evidence of eosinophilic granulomatosis. Plan-sputum cultures, CBC with differential, IgE, ANA, ANCA Neb treatment today with Xopenex, Depo-Medrol

## 2018-02-04 NOTE — Progress Notes (Signed)
Subjective:    Patient ID: Sara Trevino, female    DOB: 1965-05-20, 53 y.o.   MRN: [id number]  HPI  53 year old female never smoker followed for COPD mixed type/chronic cough, allergic rhinitis, nasal polyps Allergy Vaccine-Dr. Tupelo Callas x 3 years w/o clear benefit CT chest 11/2013 clear lungs  CT sinus 12/2013 neg for acute process  Allergy profile 07/21/14- Total IgE 249  Elevated for dust mite. Grass pollens, tree pollens CBC w diff 07/21/14- EOS  13.6 H Allergy skin test 04/21/11 elevated dog, weed pollens, tree pollens molds  She complains primarily of coughing 5 years. Occasional white or yellow sputum. 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 Does not recognize association between symptoms and workplace Had seen Dr. Lyda Jester for second opinion and he also thought Nucala might be appropriate She was not interested in [place] because it is "too new". PFT 02/23/14 moderate obstructive airways disease, significant response to bronchodilator, air trapping, normal diffusion, normal TLC. FEV1/FVC 0.68,  She was seen at [facility] by ENT/ Dr Senior who identified nasal polyps and has scheduled a CT of sinuses and recommended bilateral endoscopic sinus surgery Quantiferon Gold TB assay Positive 2018  PFT 11/02/16-moderately severe obstructive airways disease with insignificant response to bronchodilator. Diffusion slightly reduced ----------------------------------------------------------------------------------------------r.  05/21/2017- 53 year old female never smoker( + second-hand) followed for / AsthmaCOPD mixed type, allergic rhinitis, nasal polyps, + Quantiferon Gold/ culture Neg ----Asthma; Pt states she went to [place] from 2-19 through 04-27-17-started having cough while there; was given prednisone and abx in [place]. about 2-3 weeks ago seen her PCP-was given neb tx for wheezing. Pt states the cough is still  there.  Upper endoscopy 02/13/2017- ? Neg ++++Seen at [facility] pulmonary 03/01/2017 for chronic cough: "Sara Trevino is a 53 y.o. female with allergic rhinitis who presented for evaluation of chronic cough. She has persistentcough with yellow mucus for 6 years, likely due to severe persistent asthma (with mild eosinophilia and IgE elevation) vs RADS (sudden onset after changing her job, esp that she is exposed to chemicals/pesticides).  Her chronic cough is associated with severe, progressive obstructive lung disease with significant loss of FEV1. She is a life-long non-smoker with no exposure history to suggest COPD. ANCA was negative which makes vasculitis (ie., EGPA /Churg-Strauss) unlikely although doesn't rule it out, esp. with nasal polyps and eosinophilia. Also, doesn't have enough criteria for ABPA (neg IgE Aspergillus specific precipitins) and total IgE is mildly elevated although this is difficult to interpret in light of frequent prednisone bursts. NTM is another consideration although absence of nodules etc on CT and negative AFB speak against it. If these are excluded, may have to r/o other systemic diseases (esp. Sarcoidosis) that could respond to steroids. Bronchoscopy is of limited value at this time. For now, will continue to treat for severe persistent asthma. Based on elevated IgE and eosinophilia, she may also be a candidate for xolair or nucala.  Recommendations: -- Continue Symbicort (to use twice daily) and PRN albuterol -- Add Spiriva and Singulair -- Would consider stopping airway clearance as it may worsen asthma  -- Will reach out to her pulmonologist re: biologics -- She expressed her wishes to follow with local pulmonologist in [facility] " ++++ She is willing to retry Trelegy and to consider a biologic-we discussed Nucala versus Xolair Patient seen in the office today and instructed on use of Trelegy.  Patient expressed understanding and demonstrated technique.  Vivianne SpenceKatie  Welchel,CMA   09/25/2017- 53 year old female never smoker( + second-hand) followed for / AsthmaCOPD mixed type, chronic cough, allergic rhinitis, nasal polyps, + Quantiferon Gold/ culture Neg -----For the past week, she has had a productive cough with yellow phlegm. Chills and body aches. Increased SOB and fatigue. Denies any issues with nasal congestion.  Lab 05/21/2017-  EOS 13%, IgE 509 Pro-air HFA, Symbicort 80 1 week exacerbation of cough-already on day 3/7 of cephalexin.  Intermittent prednisone and antibiotics this year which we reviewed.  Daily cough is now worse.  02/04/2018- 53 year old female never smoker( + second-hand) followed for / AsthmaCOPD mixed type, chronic cough, allergic rhinitis, nasal polyps, + Quantiferon Gold/ culture Neg -----3 month f/u for cough. Patient has yet to start Xolair injections. Productive cough with yellow phlegm  and having rounds of SOB.  She denies fever or adenopathy. She did not like Trelegy and prefers Symbicort which she uses twice daily.  Benzonatate does not help. She had remained tentative about Xolair and application has not begun.  A cousin, a retired Development worker, communityphysician in [place], apparently told her he was concerned that Xolair might hurt her immune system.  I discussed Xolair and available Biologics again.  We reviewed the radiology report which continues to suggest chronic infection/inflammation [facility].  I reviewed the lab results which have suggested an allergic component with elevated IgE, eosinophils and prior Allergy Profile. CT chest Hi Res 10/16/17-  IMPRESSION: Mild cylindrical bronchiectasis with associated diffuse bronchial wall thickening and mild mucoid impaction scattered throughout the mid to upper lungs bilaterally, minimally worsened since 2017 high-resolution chest CT study. 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 Aortic Atherosclerosis (ICD10-I70.0).  ROS-see HPI   + = positive Constitutional:    +weight loss, night sweats, fevers, chills, fatigue, lassitude. HEENT:    headaches, difficulty swallowing, tooth/dental problems, +sore throat,       +sneezing, +itching, ear ache, +nasal congestion, post nasal drip, snoring CV:    chest pain, orthopnea, PND, swelling in lower extremities, anasarca,                                                     dizziness, palpitations Resp:   +shortness of breath with exertion or at rest.                +productive cough,   +non-productive cough, coughing up of blood.              change in color of mucus.  + wheezing.   Skin:    rash or lesions. GI:  No-   heartburn, indigestion, abdominal pain, nausea, vomiting, diarrhea,                 change in bowel habits, loss of appetite GU: dysuria, change in color of urine, no urgency or frequency.   flank pain. MS:   joint pain, +stiffness, decreased range of motion, back pain. Neuro-     nothing unusual Psych:  change in mood or affect.  depression or anxiety.   memory loss.    Objective:   Physical Exam OBJ- Physical Exam General- Alert, Oriented, Affect-appropriate, Distress- none acute, + slender Skin- rash-none, lesions- none, excoriation- none Lymphadenopathy- none Head- atraumatic            Eyes- Gross vision  intact, PERRLA, conjunctivae and secretions clear            Ears- Hearing, canals-normal            Nose- + Mild turbinate edema, no-Septal dev, mucus, polyps, erosion, perforation             Throat- Mallampati II , mucosa clear , drainage- none, tonsils- atrophic Neck- flexible , trachea midline, no stridor , thyroid nl, carotid no bruit Chest - symmetrical excursion , unlabored           Heart/CV- RRR , no murmur , no gallop  , no rub, nl s1 s2                           - JVD- none , edema- none, stasis changes- none, varices- none           Lung-  cough +, wheeze+, dullness-none, rub- none           Chest wall-  Abd-  Br/ Gen/ Rectal- Not done,  not indicated Extrem- cyanosis- none, clubbing, none, atrophy- none, strength- nl Neuro- grossly intact to observation     Assessment & Plan:

## 2018-02-04 NOTE — Patient Instructions (Addendum)
Order- lab- CBC w diff, total IgE, ANA, ANCA    Dx Asthma/ COPD overlap                      Sputum C&S- routine, fungal and mycobacteria/ fluorochrome (AFB)  Order- neb xop 0.63             Depo 80  Please call as needed

## 2018-02-05 ENCOUNTER — Other Ambulatory Visit: Payer: BLUE CROSS/BLUE SHIELD

## 2018-02-05 DIAGNOSIS — J449 Chronic obstructive pulmonary disease, unspecified: Secondary | ICD-10-CM

## 2018-02-06 ENCOUNTER — Telehealth: Payer: Self-pay | Admitting: Internal Medicine

## 2018-02-06 LAB — ANTI-NUCLEAR AB-TITER (ANA TITER): ANA Titer 1: 1:40 {titer} — ABNORMAL HIGH

## 2018-02-06 LAB — IGE: IGE (IMMUNOGLOBULIN E), SERUM: 915 kU/L — AB (ref <=114)

## 2018-02-06 LAB — ANCA SCREEN W REFLEX TITER: ANCA SCREEN: NEGATIVE

## 2018-02-06 LAB — ANA: Anti Nuclear Antibody(ANA): POSITIVE — AB

## 2018-02-06 NOTE — Telephone Encounter (Signed)
Called pt, the nurse who fill this out and had the pt sign enrollment forgot to get her to sign consent form. Pt is coming in this Fri. To do so. Waiting for pt to come in.

## 2018-02-15 ENCOUNTER — Encounter: Payer: Self-pay | Admitting: *Deleted

## 2018-03-21 LAB — MYCOBACTERIA,CULT W/FLUOROCHROME SMEAR
MICRO NUMBER:: 83301
SMEAR:: NONE SEEN
SPECIMEN QUALITY:: ADEQUATE

## 2018-03-21 LAB — RESPIRATORY CULTURE OR RESPIRATORY AND SPUTUM CULTURE
MICRO NUMBER:: 83302
RESULT:: NORMAL
SPECIMEN QUALITY:: ADEQUATE

## 2018-03-21 NOTE — Telephone Encounter (Signed)
Attempted to contact pt to follow up on this. There was no answer and I could not leave a message due to her voicemail being full. Will try back later.

## 2018-03-22 NOTE — Progress Notes (Signed)
Spoke with pt and notified of results per Dr. Young Pt verbalized understanding and denied any questions. 

## 2018-04-10 ENCOUNTER — Telehealth: Payer: Self-pay | Admitting: Internal Medicine

## 2018-04-10 NOTE — Telephone Encounter (Signed)
Unable to reach LMTCB 

## 2018-04-11 MED ORDER — EPINEPHRINE 0.3 MG/0.3ML IJ SOAJ: INTRAMUSCULAR | 11 refills | Status: DC

## 2018-04-11 MED ORDER — ALBUTEROL SULFATE HFA 108 (90 BASE) MCG/ACT IN AERS: INHALATION_SPRAY | RESPIRATORY_TRACT | 11 refills | Status: DC

## 2018-04-11 NOTE — Telephone Encounter (Signed)
Ok to add Epipen, generic ok, # 1, inject into thigh for severe allergic reaction, refill x 1 year

## 2018-04-11 NOTE — Telephone Encounter (Signed)
Rx for epipen has been sent to pt's pharmacy. Attempted to call pt to let her know this but unable to reach her and unable to leave a VM due to mailbox being full. Will try to call back later to make sure she is aware that this has been taken care of in regards to Rx of Proair and Epipen.

## 2018-04-11 NOTE — Telephone Encounter (Signed)
Spoke with pt. She is requesting a refill on Proair and Epipen. Proair has been refilled.  CY - please advise on Epipen as we do not have this listed on the pt's active medication list. Thanks.

## 2018-04-12 NOTE — Telephone Encounter (Signed)
Called and spoke with pt letting her know that the Rx for epipen and proair inhaler were sent to pharmacy for her and pt stated that she had already picked them up. Nothing further needed.

## 2018-04-17 NOTE — Telephone Encounter (Signed)
Spoke with pt. She states that she would like to proceed with this process of starting Xolair. At this time she is not comfortable coming to the office to sign the proper paperwork. I have offered to mail this paperwork to her. Forms have been placed in the outgoing mail.

## 2018-04-23 NOTE — Telephone Encounter (Signed)
As of today, these forms have not been received back to the office.

## 2018-04-24 NOTE — Telephone Encounter (Signed)
LMTCB x1 for pt to follow up on Xolair forms.

## 2018-04-25 NOTE — Telephone Encounter (Signed)
Spoke with pt. States that she received the forms but wants to know if CY still thinks that she needs Xolair. While speaking to the pt she seemed unsure if she wanted to start this medication.  CY - please advise. Thanks!

## 2018-05-06 ENCOUNTER — Ambulatory Visit: Payer: BLUE CROSS/BLUE SHIELD | Admitting: Internal Medicine

## 2018-05-15 NOTE — Telephone Encounter (Signed)
Due this day, we have not received the pt's enrollment forms. At this time, this message will be closed.

## 2018-05-27 ENCOUNTER — Other Ambulatory Visit: Payer: Self-pay | Admitting: Family Medicine

## 2018-05-27 DIAGNOSIS — Z79899 Other long term (current) drug therapy: Secondary | ICD-10-CM

## 2018-08-12 ENCOUNTER — Telehealth: Payer: Self-pay | Admitting: Internal Medicine

## 2018-08-12 MED ORDER — BUDESONIDE-FORMOTEROL FUMARATE 80-4.5 MCG/ACT IN AERO: 2.0000 | INHALATION_SPRAY | Freq: Two times a day (BID) | RESPIRATORY_TRACT | 11 refills | Status: DC

## 2018-08-12 NOTE — Telephone Encounter (Signed)
Called & spoke w/ pt. Pt states she needs a new Rx for Symbicort 80 sent to [facility]. Pt last seen 02/04/2018 by CY. Symbicort 80 was last prescribed 06/28/2017 w/ 11 refills. I let her know we would sent in an order for Symbicort 80 to her pharmacy. Pt verbalized understanding with no additional questions. Order has been placed. Nothing further needed at this time.

## 2018-08-16 ENCOUNTER — Ambulatory Visit: Payer: BLUE CROSS/BLUE SHIELD | Admitting: Internal Medicine

## 2018-08-27 ENCOUNTER — Other Ambulatory Visit: Payer: Self-pay

## 2018-08-27 ENCOUNTER — Ambulatory Visit
Admission: RE | Admit: 2018-08-27 | Discharge: 2018-08-27 | Disposition: A | Discharge status: Home / Self Care | Payer: BC Managed Care – PPO | Source: Ambulatory Visit | Attending: Family Medicine | Admitting: Family Medicine

## 2018-08-27 DIAGNOSIS — Z79899 Other long term (current) drug therapy: Secondary | ICD-10-CM

## 2018-10-31 ENCOUNTER — Encounter: Payer: Self-pay | Admitting: Internal Medicine

## 2018-10-31 ENCOUNTER — Ambulatory Visit: Payer: BLUE CROSS/BLUE SHIELD | Admitting: Internal Medicine

## 2018-10-31 ENCOUNTER — Other Ambulatory Visit: Payer: Self-pay

## 2018-10-31 DIAGNOSIS — R9389 Abnormal findings on diagnostic imaging of other specified body structures: Secondary | ICD-10-CM

## 2018-10-31 DIAGNOSIS — J4541 Moderate persistent asthma with (acute) exacerbation: Secondary | ICD-10-CM

## 2018-10-31 DIAGNOSIS — J471 Bronchiectasis with (acute) exacerbation: Secondary | ICD-10-CM | POA: Diagnosis not present

## 2018-10-31 MED ORDER — BREZTRI AEROSPHERE 160-9-4.8 MCG/ACT IN AERO: 2.0000 | INHALATION_SPRAY | Freq: Two times a day (BID) | RESPIRATORY_TRACT | 0 refills | Status: DC

## 2018-10-31 NOTE — Patient Instructions (Signed)
Order- schedule CT chest Hi Res     Dx Chronic Bronchitis, Bronchiectasis  Order- Culture sputum- routine C&S, Fungal, Mycobacteria/fluorochrome AFB  Order- Start Xolair injections  Order- Covid swab  Sample Breztri inhaler     Inhale 2 puffs then rinse mouth, twice daily    Try this instead of Symbicort. If you like Breztri better, please let us know

## 2018-10-31 NOTE — Progress Notes (Signed)
Subjective:    Patient ID: Sara Trevino, female    DOB: 01-11-1966, 53 y.o.   MRN: [id number]  HPI  53 year old female never smoker followed for COPD mixed type/chronic cough, allergic rhinitis, nasal polyps Allergy Vaccine-Dr. Allen Callas x 3 years w/o clear benefit CT chest 11/2013 clear lungs  CT sinus 12/2013 neg for acute process  Allergy profile 07/21/14- Total IgE 249  Elevated for dust mite. Grass pollens, tree pollens CBC w diff 07/21/14- EOS  13.6 H Allergy skin test 04/21/11 elevated dog, weed pollens, tree pollens molds  She complains primarily of coughing 5 years. Occasional white or yellow sputum. 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 Does not recognize association between symptoms and workplace Had seen Dr. Lyda Jester for second opinion and he also thought Nucala might be appropriate She was not interested in [place] because it is "too new". PFT 02/23/14 moderate obstructive airways disease, significant response to bronchodilator, air trapping, normal diffusion, normal TLC. FEV1/FVC 0.68,  She was seen at [facility] by ENT/ Dr Senior who identified nasal polyps and has scheduled a CT of sinuses and recommended bilateral endoscopic sinus surgery Quantiferon Gold TB assay Positive 2018  PFT 11/02/16-moderately severe obstructive airways disease with insignificant response to bronchodilator. Diffusion slightly reduced ----------------------------------------------------------------------------------------------r.  02/04/2018- 53 year old female never smoker( + second-hand) followed for / AsthmaCOPD mixed type, chronic cough, allergic rhinitis, nasal polyps,  Quantiferon Gold positive / culture Neg -----3 month f/u for cough. Patient has yet to start Xolair injections. Productive cough with yellow phlegm  and having rounds of SOB.  She denies fever or adenopathy. She did not like Trelegy and prefers  Symbicort which she uses twice daily.  Benzonatate does not help. She had remained tentative about Xolair and application has not begun.  A cousin, a retired Development worker, community in [place], apparently told her he was concerned that Xolair might hurt her immune system.  I discussed Xolair and available Biologics again.  We reviewed the radiology report which continues to suggest chronic infection/inflammation consistent with [facility].  I reviewed the lab results which have suggested an allergic component with elevated IgE, eosinophils and prior Allergy Profile. CT chest Hi Res 10/16/17-  IMPRESSION: Mild cylindrical bronchiectasis with associated diffuse bronchial wall thickening and mild mucoid impaction scattered throughout the mid to upper lungs bilaterally, minimally worsened since 2017 high-resolution chest CT study. 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 Aortic Atherosclerosis (ICD10-I70.0).  10/31/2018-  53 year old female never smoker( + second-hand) followed for / AsthmaCOPD mixed type/ Bronchiectasis, chronic cough, allergic rhinitis, nasal polyps, + Quantiferon Gold/ culture Neg Elevated IgE and Eosinophils, but she has not wanted to try Biologics. She preferred Symbicort over Trilogy -----pt reports worsening cough w/ yellow mucus, historically treated by steroid injections and/or pred taper and Zpak Symbicort 80, ProAir hfa Has gotten depo injections 3 ttimes, and a prednisone taper this summer for breathing. Zpak twice. Coughing more with some sweats and yellow sputum. No blood or fever. She now asks about Xolair- finally ready to try it. Had flu vax.  Asks letter to work from home- recognizes she is at risk of more severe covid complications.   ROS-see HPI  + = positive Constitutional:    +weight loss, night sweats, fevers, chills, fatigue, lassitude. HEENT:    headaches, difficulty swallowing, tooth/dental problems, +sore throat,        +sneezing, +itching, ear ache, +nasal congestion,  post nasal drip, snoring CV:    chest pain, orthopnea, PND, swelling in lower extremities, anasarca,                                                     dizziness, palpitations Resp:   +shortness of breath with exertion or at rest.                +productive cough,   +non-productive cough, coughing up of blood.              +change in color of mucus.  + wheezing.   Skin:    rash or lesions. GI:  No-   heartburn, indigestion, abdominal pain, nausea, vomiting, diarrhea,                 change in bowel habits, loss of appetite GU: dysuria, change in color of urine, no urgency or frequency.   flank pain. MS:   joint pain, +stiffness, decreased range of motion, back pain. Neuro-     nothing unusual Psych:  change in mood or affect.  depression or anxiety.   memory loss.    Objective:   Physical Exam OBJ- Physical Exam General- Alert, Oriented, Affect-appropriate, Distress- none acute, + thin Skin- rash-none, lesions- none, excoriation- none Lymphadenopathy- none Head- atraumatic            Eyes- Gross vision intact, PERRLA, conjunctivae and secretions clear            Ears- Hearing, canals-normal            Nose- + Mild turbinate edema, no-Septal dev, mucus, polyps, erosion, perforation             Throat- Mallampati II , mucosa clear , drainage- none, tonsils- atrophic Neck- flexible , trachea midline, no stridor , thyroid nl, carotid no bruit Chest - symmetrical excursion , unlabored           Heart/CV- RRR , no murmur , no gallop  , no rub, nl s1 s2                           - JVD- none , edema- none, stasis changes- none, varices- none           Lung-  cough +, wheeze+, dullness-none, rub- none           Chest wall-  Abd-  Br/ Gen/ Rectal- Not done, not indicated Extrem- cyanosis- none, clubbing, none, atrophy- none, strength- nl Neuro- grossly intact to observation     Assessment & Plan:

## 2018-10-31 NOTE — Assessment & Plan Note (Signed)
Chronic persistent asthmatic bronchits Plan- reculture sputum, Try Xolair, letter to work from home

## 2018-10-31 NOTE — Assessment & Plan Note (Signed)
Chronic bronchitis/ bronchiectasis. Plan- covid swab, reculture, update CT for progression, start Xolair, try sample Breztri triple instead of Symbicort

## 2018-11-01 ENCOUNTER — Telehealth: Payer: Self-pay | Admitting: Internal Medicine

## 2018-11-01 ENCOUNTER — Other Ambulatory Visit: Payer: Self-pay

## 2018-11-01 ENCOUNTER — Other Ambulatory Visit: Payer: BC Managed Care – PPO

## 2018-11-01 DIAGNOSIS — J471 Bronchiectasis with (acute) exacerbation: Secondary | ICD-10-CM

## 2018-11-01 DIAGNOSIS — Z20822 Contact with and (suspected) exposure to covid-19: Secondary | ICD-10-CM

## 2018-11-01 NOTE — Telephone Encounter (Signed)
Message routed to Dr Young. °

## 2018-11-01 NOTE — Telephone Encounter (Signed)
Received Xolair enrollment application from Janeth Rase, LPN. Xolair dosage incomplete.  Referring to the Xolair dosage table, Patient IgE/weight range is do not dose.  Dr Annamaria Boots, please advise on Xolair dosing

## 2018-11-03 LAB — NOVEL CORONAVIRUS, NAA: SARS-CoV-2, NAA: NOT DETECTED

## 2018-11-04 NOTE — Telephone Encounter (Signed)
Discussed- suggest 375 mg/ 2 weeks

## 2018-11-04 NOTE — Telephone Encounter (Signed)
Xolair enrollment application completed with 375mg  every 2 weeks, prefilled syringes. Xolair application faxed to [place]. Will wait for summary of benefits.

## 2018-11-06 NOTE — Telephone Encounter (Signed)
Received summary of benefits. PA is required for Xolair. Insurance will need to be contacted at [phone]. [facility] per the summary of benefits.

## 2018-11-07 NOTE — Telephone Encounter (Addendum)
Called [facility] to start PA process. Was advised that I would need to contact [phone] to start PA. PA has been started over the phone. Clinical information and notes have been faxed to [phone] referencing PA # [id number]. Will await PA decision.

## 2018-11-12 NOTE — Telephone Encounter (Signed)
Cary calling from [place] wants to know if this patient will be buy and bill or going through specialty pharmacy - he can be reached at [phone]

## 2018-11-12 NOTE — Telephone Encounter (Signed)
Received fax confirmation that the fax went through at [phone].

## 2018-11-12 NOTE — Telephone Encounter (Signed)
Received a fax from [place] dated 11/11/2018. They did not receive the clinical information that I faxed on 11/07/2018. All information has been refaxed to [phone].

## 2018-11-12 NOTE — Telephone Encounter (Signed)
[place] and advised them that we are Heritage manager. Will continue to wait on PA decision.

## 2018-11-14 NOTE — Telephone Encounter (Signed)
Called [facility] to follow up on PA. Spoke with Anesha. States that the pt's clinical notes and information are still being reviewed. Will continue to follow up.

## 2018-11-18 MED ORDER — OMALIZUMAB 75 MG/0.5ML ~~LOC~~ SOSY: 75.0000 mg | PREFILLED_SYRINGE | SUBCUTANEOUS | 12 refills | Status: DC

## 2018-11-18 MED ORDER — OMALIZUMAB 150 MG/ML ~~LOC~~ SOSY: 300.0000 mg | PREFILLED_SYRINGE | SUBCUTANEOUS | 12 refills | Status: DC

## 2018-11-18 NOTE — Telephone Encounter (Signed)
Called [facility] to follow up on PA. PA has been approved 11/16/2018 - 05/17/2019. Rx for Xolair has been [facility] to [facility]. Will follow up later this week in setting up shipment.

## 2018-11-22 NOTE — Telephone Encounter (Signed)
Called [facility] to set up pt's shipment. Was placed on an extremely long hold with no one coming to the line. Will try back. 

## 2018-11-26 ENCOUNTER — Ambulatory Visit (INDEPENDENT_AMBULATORY_CARE_PROVIDER_SITE_OTHER)
Admission: RE | Admit: 2018-11-26 | Discharge: 2018-11-26 | Disposition: A | Discharge status: Home / Self Care | Payer: BC Managed Care – PPO | Source: Ambulatory Visit | Attending: Internal Medicine | Admitting: Internal Medicine

## 2018-11-26 ENCOUNTER — Other Ambulatory Visit: Payer: Self-pay

## 2018-11-26 DIAGNOSIS — J471 Bronchiectasis with (acute) exacerbation: Secondary | ICD-10-CM | POA: Diagnosis not present

## 2018-11-26 NOTE — Telephone Encounter (Signed)
Called [facility] to set up shipment for Xolair. Was advised that the pt will have to call in to set up her patient profile and give consent to ship.  Spoke with pt. She has been given all of [facility] information.  Will continue to follow up.

## 2018-11-28 NOTE — Telephone Encounter (Signed)
Called [facility] to check on the status of her shipment. Was advised that they have still not been able to get in touch with the pt to set up her patient profile or get her consent to ship.  LMTCB x1 for pt to follow up.

## 2018-11-28 NOTE — Telephone Encounter (Signed)
Patient states would like to discuss Xolair injection.  Patient phone number is [phone].

## 2018-12-06 NOTE — Telephone Encounter (Signed)
Called and spoke with Patient.  Patient stated she had set up account with [facility], but cost would be to expensive.  Patient stated [facility] told her co pay assistance was available and her cost would be $50. I will follow up with Ria Comment next week with possible Patient assistance for Xolair.

## 2018-12-17 LAB — RESPIRATORY CULTURE OR RESPIRATORY AND SPUTUM CULTURE
MICRO NUMBER:: 998323
RESULT:: NORMAL
SPECIMEN QUALITY:: ADEQUATE

## 2018-12-17 LAB — MYCOBACTERIA,CULT W/FLUOROCHROME SMEAR
MICRO NUMBER:: 998322
SMEAR:: NONE SEEN
SPECIMEN QUALITY:: ADEQUATE

## 2018-12-18 NOTE — Telephone Encounter (Signed)
LMTCB x1 for pt. Pt will need to reach out to [facility] for patient assistance.

## 2018-12-20 NOTE — Telephone Encounter (Signed)
Called and spoke with Patient.  Patient stated she has contacted [place] and has co pay assistance/co pay card information coming to her through mail.  Patient stated she will contact office once she receives patient assistance information. Will continue to follow up.

## 2018-12-25 NOTE — Telephone Encounter (Signed)
Called spoke with patient who reported that she did call [place] on 12.4.2020 and is now awaiting a copay card to arrive in the mail.  Patient will call the office once she receives this.

## 2019-01-21 ENCOUNTER — Telehealth: Payer: Self-pay | Admitting: Internal Medicine

## 2019-01-21 MED ORDER — BUDESONIDE-FORMOTEROL FUMARATE 80-4.5 MCG/ACT IN AERO: 2.0000 | INHALATION_SPRAY | Freq: Two times a day (BID) | RESPIRATORY_TRACT | 3 refills | Status: DC

## 2019-01-21 NOTE — Telephone Encounter (Signed)
Spoke with patient. She stated that [facility] [place] [facility] [address]. Advised her that I will update the pharmacy in her chart.   While on the phone she also mentioned that she will need a refill on her Symbicort. She [facility] Advised her that I would send this in for her. She verbalized understanding. Nothing further needed at time of call.

## 2019-01-31 ENCOUNTER — Ambulatory Visit: Payer: BC Managed Care – PPO | Admitting: Internal Medicine

## 2019-03-17 ENCOUNTER — Ambulatory Visit: Payer: BC Managed Care – PPO | Admitting: Internal Medicine

## 2019-03-28 ENCOUNTER — Ambulatory Visit: Payer: BC Managed Care – PPO

## 2019-04-14 ENCOUNTER — Ambulatory Visit: Payer: BC Managed Care – PPO | Admitting: Internal Medicine

## 2019-04-17 ENCOUNTER — Telehealth: Payer: Self-pay | Admitting: Pharmacy Technician

## 2019-04-17 NOTE — Telephone Encounter (Signed)
Received fax request for Xolair Prior auth renewal. Completed form and placed in MD box for signature. Will follow up.  3:58 PM Dorthula Nettles, CPhT

## 2019-04-29 NOTE — Telephone Encounter (Signed)
Received signed Prior Authorization form. Faxed to [facility]. Will update when we receive a response.  Fax# [phone] Phone# [phone]  9:11 AM Dorthula Nettles, CPhT

## 2019-05-01 NOTE — Telephone Encounter (Signed)
Received notification from [facility] regarding a prior authorization for XOLAIR. Authorization has been APPROVED from 04/29/19 to 04/28/20.   Authorization # [id number]

## 2019-05-30 ENCOUNTER — Ambulatory Visit: Payer: BC Managed Care – PPO | Admitting: Internal Medicine

## 2019-05-30 ENCOUNTER — Other Ambulatory Visit: Payer: Self-pay

## 2019-05-30 ENCOUNTER — Encounter: Payer: Self-pay | Admitting: Internal Medicine

## 2019-05-30 VITALS — BP 106/72 | HR 76 | Temp 98.3°F | Ht 64.0 in | Wt 97.2 lb

## 2019-05-30 DIAGNOSIS — J4541 Moderate persistent asthma with (acute) exacerbation: Secondary | ICD-10-CM | POA: Diagnosis not present

## 2019-05-30 DIAGNOSIS — J479 Bronchiectasis, uncomplicated: Secondary | ICD-10-CM | POA: Diagnosis not present

## 2019-05-30 DIAGNOSIS — Z20822 Contact with and (suspected) exposure to covid-19: Secondary | ICD-10-CM

## 2019-05-30 MED ORDER — BUDESONIDE-FORMOTEROL FUMARATE 160-4.5 MCG/ACT IN AERO: INHALATION_SPRAY | RESPIRATORY_TRACT | 6 refills | Status: DC

## 2019-05-30 MED ORDER — ALBUTEROL SULFATE HFA 108 (90 BASE) MCG/ACT IN AERS: INHALATION_SPRAY | RESPIRATORY_TRACT | 12 refills | Status: AC

## 2019-05-30 MED ORDER — FLUTTER DEVI: 0 refills | Status: AC

## 2019-05-30 MED ORDER — BREZTRI AEROSPHERE 160-9-4.8 MCG/ACT IN AERO: 2.0000 | INHALATION_SPRAY | Freq: Two times a day (BID) | RESPIRATORY_TRACT | 0 refills | Status: DC

## 2019-05-30 NOTE — Progress Notes (Signed)
Subjective:    Patient ID: Sara Trevino, female    DOB: 1965/05/19, 54 y.o.   MRN: [id number]  HPI  54 year old female never smoker followed for COPD mixed type/chronic cough, allergic rhinitis, nasal polyps Allergy Vaccine-Dr. Donneta Romberg x 3 years w/o clear benefit CT chest 11/2013 clear lungs  CT sinus 12/2013 neg for acute process  Allergy profile 07/21/14- Total IgE 249  Elevated for dust mite. Grass pollens, tree pollens CBC w diff 07/21/14- EOS  13.6 H Allergy skin test 04/21/11 elevated dog, weed pollens, tree pollens molds  She complains primarily of coughing 5 years. Occasional white or yellow sputum. 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 Does not recognize association between symptoms and workplace Had seen Dr. Fredrik Rigger for second opinion and he also thought Nucala might be appropriate She was not interested in [place] because it is "too new". PFT 02/23/14 moderate obstructive airways disease, significant response to bronchodilator, air trapping, normal diffusion, normal TLC. FEV1/FVC 0.68,  She was seen at [facility] by ENT/ Dr Senior who identified nasal polyps and has scheduled a CT of sinuses and recommended bilateral endoscopic sinus surgery Quantiferon Gold TB assay Positive 2018  PFT 11/02/16-moderately severe obstructive airways disease with insignificant response to bronchodilator. Diffusion slightly reduced ----------------------------------------------------------------------------------------------r.   10/31/2018-  54 year old female never smoker( + second-hand) followed for / AsthmaCOPD mixed type/ Bronchiectasis, chronic cough, allergic rhinitis, nasal polyps, + Quantiferon Gold/ culture Neg  She preferred Symbicort over Trelogy -----pt reports worsening cough w/ yellow mucus, historically treated by steroid injections and/or pred taper and Zpak Symbicort 80, ProAir hfa Has gotten depo  injections 3 ttimes, and a prednisone taper this summer for breathing. Zpak twice. Coughing more with some sweats and yellow sputum. No blood or fever. She now asks about Xolair- finally ready to try it. Had flu vax.  Asks letter to work from home- recognizes she is at risk of more severe covid complications.   05/30/19-  55 year old female never smoker( + second-hand) followed for / AsthmaCOPD mixed type/ Bronchiectasis, chronic cough, allergic rhinitis, nasal polyps, + Quantiferon Gold/ culture Neg Elevated IgE and Eosinophils, but she has not wanted to try Biologics. Xolair- never started  But was approved by [facility] as of 04/29/19 Symbicort 160, ProAir hfa (preferred Symbicort over Trelegy). -----f/u Bronchiectasis with acute exacerbation. Has had 2 Moderna Covax. Working from home. Asks Covid ab test "to see if vaccine worked". Not willing to start Xolair still, expressing concern it might impact resistance to Covid infection- discussed.  HRCT chest 11/26/18-  IMPRESSION: Stable chest CT. Chronic mild cylindrical bronchiectasis scattered throughout both lungs, most prominent in the right middle lobe and upper lobe. Diffuse bronchial wall thickening. Scattered mild mucoid impaction. No new or progressive findings. Aortic Atherosclerosis (ICD10-I70.0).   ROS-see HPI  + = positive Constitutional:    +weight loss, night sweats, fevers, chills, fatigue, lassitude. HEENT:    headaches, difficulty swallowing, tooth/dental problems, +sore throat,       +sneezing, +itching, ear ache, +nasal congestion, post nasal drip, snoring CV:    chest pain, orthopnea, PND, swelling in lower extremities, anasarca,                                                     dizziness,  palpitations Resp:   +shortness of breath with exertion or at rest.                +productive cough,   +non-productive cough, coughing up of blood.              +change in color of mucus.  + wheezing.   Skin:    rash or  lesions. GI:  No-   heartburn, indigestion, abdominal pain, nausea, vomiting, diarrhea,                 change in bowel habits, loss of appetite GU: dysuria, change in color of urine, no urgency or frequency.   flank pain. MS:   joint pain, +stiffness, decreased range of motion, back pain. Neuro-     nothing unusual Psych:  change in mood or affect.  depression or anxiety.   memory loss.    Objective:   Physical Exam OBJ- Physical Exam General- Alert, Oriented, Affect-appropriate, Distress- none acute, + thin Skin- rash-none, lesions- none, excoriation- none Lymphadenopathy- none Head- atraumatic            Eyes- Gross vision intact, PERRLA, conjunctivae and secretions clear            Ears- Hearing, canals-normal            Nose- + Mild turbinate edema, no-Septal dev, mucus, polyps, erosion, perforation             Throat- Mallampati II , mucosa clear , drainage- none, tonsils- atrophic Neck- flexible , trachea midline, no stridor , thyroid nl, carotid no bruit Chest - symmetrical excursion , unlabored           Heart/CV- RRR , no murmur , no gallop  , no rub, nl s1 s2                           - JVD- none , edema- none, stasis changes- none, varices- none           Lung-  cough + congested, wheeze- none, dullness-none, rub- none           Chest wall-  Abd-  Br/ Gen/ Rectal- Not done, not indicated Extrem- cyanosis- none, clubbing, none, atrophy- none, strength- nl Neuro- grossly intact to observation     Assessment & Plan:

## 2019-05-30 NOTE — Assessment & Plan Note (Addendum)
Clinically stable, but significant. We have not cultured MAIC/ AFB but continue to watch.  Plan- Flutter device

## 2019-05-30 NOTE — Assessment & Plan Note (Signed)
Bronchospastic component. We have talked for several years about adding a Biologic such as Xolair. She has been approved, but remains reluctant. We may eventually choose to switch to a different product. Alternatives have been discussed but Sara Trevino was the one she has learned about.

## 2019-05-30 NOTE — Patient Instructions (Signed)
  Sample x 2 Breztri inhaler    Inhale 2 puffs, then rinse mouth, twice daily Try this instead of Symbicort 160. If you like the [facility] better, let us know and we can send a prescription. Otherwise, just stay with Symbicort.  We have approval from your insurance to start Xolair injections if you decide you want to.  Refill prescription sent for albuterol rescue inhaler.  We decided not to refill your Epipen now, since you have never needed to use it.  Script printed for Flutter device to help clear mucus from your airways.      Blow through 4 times per set, 3 sets per day, when needed.  Order- lab- Covid antibody assay    Dx possible exposure

## 2019-12-04 NOTE — Progress Notes (Signed)
Subjective:    Patient ID: Sara Trevino, female    DOB: [birth year], 54 y.o.   MRN: [id number]  HPI  54 year old female never smoker followed for COPD mixed type/chronic cough, allergic rhinitis, nasal polyps Allergy Vaccine-Dr. Frazee Callas x 3 years w/o clear benefit CT chest 11/2013 clear lungs  CT sinus 12/2013 neg for acute process  Allergy profile 07/21/14- Total IgE 249  Elevated for dust mite. Grass pollens, tree pollens CBC w diff 07/21/14- EOS  13.6 H Allergy skin test 04/21/11 elevated dog, weed pollens, tree pollens molds  She complains primarily of coughing 5 years. Occasional white or yellow sputum. 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 Does not recognize association between symptoms and workplace Had seen Dr. Lyda Jester for second opinion and he also thought Nucala might be appropriate She was not interested in [place] because it is "too new". PFT 02/23/14 moderate obstructive airways disease, significant response to bronchodilator, air trapping, normal diffusion, normal TLC. FEV1/FVC 0.68,  She was seen at [facility] by ENT/ Dr Senior who identified nasal polyps and has scheduled a CT of sinuses and recommended bilateral endoscopic sinus surgery Quantiferon Gold TB assay Positive 2018  PFT 11/02/16-moderately severe obstructive airways disease with insignificant response to bronchodilator. Diffusion slightly reduced ----------------------------------------------------------------------------------------------r.   05/30/19-  54 year old female never smoker( + second-hand) followed for / AsthmaCOPD mixed type/ Bronchiectasis, chronic cough, allergic rhinitis, nasal polyps, + Quantiferon Gold/ culture Neg Elevated IgE and Eosinophils, but she has not wanted to try Biologics. Xolair- never started  But was approved by [facility] as of 04/29/19 Symbicort 160, ProAir hfa (preferred Symbicort over  Trelegy). -----f/u Bronchiectasis with acute exacerbation. Covid vax- 2 Moderna  Working from home. Asks Covid ab test "to see if vaccine worked". Not willing to start Xolair still, expressing concern it might impact resistance to Covid infection- discussed.  HRCT chest 11/26/18-  IMPRESSION: Stable chest CT. Chronic mild cylindrical bronchiectasis scattered throughout both lungs, most prominent in the right middle lobe and upper lobe. Diffuse bronchial wall thickening. Scattered mild mucoid impaction. No new or progressive findings. Aortic Atherosclerosis (ICD10-I70.0).  12/05/19- 54 year old female never smoker( + second-hand) followed for / AsthmaCOPD mixed type/ Bronchiectasis, chronic cough, allergic rhinitis, nasal polyps, + Quantiferon Gold/ culture Neg Elevated IgE and Eosinophils, but she has not wanted to try Biologics Symbicort 160, ProAir hfa (preferred Symbicort over Trelegy). Covid vax- 2 Moderna Flu vax- Had -----Wants to talk about Xolair injections, last month was having increased shortness of breath and got steroid injection from PCP, productive cough with yellow mucus Still has never started Xolair.  She says she usually has needed steroid inj about 4x/ year, but this year only twice. Reflects masking, work from home. Using rescue hfa 1-2 x/ week.  ROS-see HPI  + = positive Constitutional:    +weight loss, night sweats, fevers, chills, fatigue, lassitude. HEENT:    headaches, difficulty swallowing, tooth/dental problems, +sore throat,       +sneezing, +itching, ear ache, +nasal congestion, post nasal drip, snoring CV:    chest pain, orthopnea, PND, swelling in lower extremities, anasarca,                                                     dizziness, palpitations Resp:   +  shortness of breath with exertion or at rest.                +productive cough,   +non-productive cough, coughing up of blood.              +change in color of mucus.  + wheezing.   Skin:    rash  or lesions. GI:  No-   heartburn, indigestion, abdominal pain, nausea, vomiting, diarrhea,                 change in bowel habits, loss of appetite GU: dysuria, change in color of urine, no urgency or frequency.   flank pain. MS:   joint pain, +stiffness, decreased range of motion, back pain. Neuro-     nothing unusual Psych:  change in mood or affect.  depression or anxiety.   memory loss.    Objective:   Physical Exam OBJ- Physical Exam General- Alert, Oriented, Affect-appropriate, Distress- none acute, + thin Skin- rash-none, lesions- none, excoriation- none Lymphadenopathy- none Head- atraumatic            Eyes- Gross vision intact, PERRLA, conjunctivae and secretions clear            Ears- Hearing, canals-normal            Nose- + Mild turbinate edema, no-Septal dev, mucus, polyps, erosion, perforation             Throat- Mallampati II , mucosa clear , drainage- none, tonsils- atrophic Neck- flexible , trachea midline, no stridor , thyroid nl, carotid no bruit Chest - symmetrical excursion , unlabored           Heart/CV- RRR , no murmur , no gallop  , no rub, nl s1 s2                           - JVD- none , edema- none, stasis changes- none, varices- none           Lung-  cough + congested rhonchi+, wheeze+ , dullness-none, rub- none           Chest wall-  Abd-  Br/ Gen/ Rectal- Not done, not indicated Extrem- cyanosis- none, clubbing, none, atrophy- none, strength- nl Neuro- grossly intact to observation     Assessment & Plan:

## 2019-12-05 ENCOUNTER — Ambulatory Visit: Payer: BC Managed Care – PPO | Admitting: Internal Medicine

## 2019-12-05 ENCOUNTER — Other Ambulatory Visit: Payer: Self-pay

## 2019-12-05 ENCOUNTER — Encounter: Payer: Self-pay | Admitting: Internal Medicine

## 2019-12-05 ENCOUNTER — Telehealth: Payer: Self-pay | Admitting: Internal Medicine

## 2019-12-05 VITALS — BP 114/60 | HR 93 | Temp 97.1°F | Ht 62.0 in | Wt 93.8 lb

## 2019-12-05 DIAGNOSIS — J449 Chronic obstructive pulmonary disease, unspecified: Secondary | ICD-10-CM

## 2019-12-05 DIAGNOSIS — J479 Bronchiectasis, uncomplicated: Secondary | ICD-10-CM | POA: Diagnosis not present

## 2019-12-05 DIAGNOSIS — J471 Bronchiectasis with (acute) exacerbation: Secondary | ICD-10-CM

## 2019-12-05 DIAGNOSIS — Z20822 Contact with and (suspected) exposure to covid-19: Secondary | ICD-10-CM

## 2019-12-05 LAB — CBC WITH DIFFERENTIAL/PLATELET
Basophils Absolute: 0.1 10*3/uL (ref 0.0–0.1)
Basophils Relative: 1.1 % (ref 0.0–3.0)
Eosinophils Absolute: 0.3 10*3/uL (ref 0.0–0.7)
Eosinophils Relative: 7.7 % — ABNORMAL HIGH (ref 0.0–5.0)
HCT: 43.7 % (ref 36.0–46.0)
Hemoglobin: 14.4 g/dL (ref 12.0–15.0)
Lymphocytes Relative: 21.5 % (ref 12.0–46.0)
Lymphs Abs: 0.9 10*3/uL (ref 0.7–4.0)
MCHC: 32.9 g/dL (ref 30.0–36.0)
MCV: 95 fl (ref 78.0–100.0)
Monocytes Absolute: 0.3 10*3/uL (ref 0.1–1.0)
Monocytes Relative: 6.7 % (ref 3.0–12.0)
Neutro Abs: 2.8 10*3/uL (ref 1.4–7.7)
Neutrophils Relative %: 63 % (ref 43.0–77.0)
Platelets: 176 10*3/uL (ref 150.0–400.0)
RBC: 4.6 Mil/uL (ref 3.87–5.11)
RDW: 14 % (ref 11.5–15.5)
WBC: 4.4 10*3/uL (ref 4.0–10.5)

## 2019-12-05 LAB — SARS-COV-2 IGG: SARS-COV-2 IgG: 1.77

## 2019-12-05 MED ORDER — OMALIZUMAB 75 MG/0.5ML ~~LOC~~ SOSY: 75.0000 mg | PREFILLED_SYRINGE | SUBCUTANEOUS | 12 refills | Status: DC

## 2019-12-05 MED ORDER — BREZTRI AEROSPHERE 160-9-4.8 MCG/ACT IN AERO: 2.0000 | INHALATION_SPRAY | Freq: Two times a day (BID) | RESPIRATORY_TRACT | 0 refills | Status: DC

## 2019-12-05 MED ORDER — OMALIZUMAB 150 MG/ML ~~LOC~~ SOSY: 300.0000 mg | PREFILLED_SYRINGE | SUBCUTANEOUS | 12 refills | Status: DC

## 2019-12-05 NOTE — Telephone Encounter (Signed)
Patient has current active approval for Xolair through 04/2020 and patient should already have a copay card from previous fills.  [facility]

## 2019-12-05 NOTE — Telephone Encounter (Signed)
Patient came in for an appointment this morning and talked to Dr. Maple Hudson about starting Xolair injections. Dr. Maple Hudson and patient state they have already done the paperwork for this process.   Rachael please advise if you have anything on this patient

## 2019-12-05 NOTE — Telephone Encounter (Signed)
Xolair 375mg  every 2 weeks prescriptions sent to [facility]. Will follow up with shipment.

## 2019-12-05 NOTE — Patient Instructions (Addendum)
Order- Aerochamber spacer tube for use with inhalers  Order- 2 samples Breztri inhaler    Inhale 2 puffs then rinse mouth, twice daily Try this instead of Symbicort.  If you like it better, let us know, otherwise go back to Symbicort  Order- lab- Covid 19 antibody test    Dx previously vaccinated  Order- schedule HRCT chest, no contrast   Dx bronchiectasis exacerbation  Order- lab- IgE, CBC w diff    Dx bronchiectasis exacerbation  Order- start Xolair injections   Dx Asthma severe persistent

## 2019-12-08 LAB — IGE: IgE (Immunoglobulin E), Serum: 394 kU/L — ABNORMAL HIGH (ref <=114)

## 2019-12-09 ENCOUNTER — Telehealth: Payer: Self-pay | Admitting: Internal Medicine

## 2019-12-09 NOTE — Telephone Encounter (Signed)
Pt wants to go to [facility] she cancelled the appt @[facility]  I will call her back after I get it scheduled and change location on the auth [id number]

## 2019-12-09 NOTE — Telephone Encounter (Signed)
appt 12/10/19@11 :15am@[facility]  pt is aware Tobe Sos

## 2019-12-10 ENCOUNTER — Ambulatory Visit (HOSPITAL_BASED_OUTPATIENT_CLINIC_OR_DEPARTMENT_OTHER): Payer: BC Managed Care – PPO

## 2019-12-10 ENCOUNTER — Other Ambulatory Visit: Payer: Self-pay

## 2019-12-10 ENCOUNTER — Ambulatory Visit (INDEPENDENT_AMBULATORY_CARE_PROVIDER_SITE_OTHER)
Admission: RE | Admit: 2019-12-10 | Discharge: 2019-12-10 | Disposition: A | Discharge status: Home / Self Care | Payer: BC Managed Care – PPO | Source: Ambulatory Visit | Attending: Internal Medicine | Admitting: Internal Medicine

## 2019-12-10 DIAGNOSIS — J479 Bronchiectasis, uncomplicated: Secondary | ICD-10-CM | POA: Diagnosis not present

## 2019-12-15 NOTE — Telephone Encounter (Signed)
[facility]ed [facility] to order [facility]air shipment to office.  Rep stated Patient had a $100 co pay. I was further told Patient consent was needed to ship Xolair to office. I called Patient to let her know consent to ship was needed.  Patient stated she had co pay assist number [facility] contact information given. Will follow up with shipment later this week.    Xolair Prefilled Syringe Order: 150mg  Prefilled Syringe:  #4 75mg  Prefilled Syringe: #2 Ordered Date: 12/22/19 Expected date of arrival: 12/24/19 Ordered by: Bristol Soy,LPN [facility]  Will call and schedule injection appointment after Xolair delivery to office.

## 2019-12-24 ENCOUNTER — Telehealth: Payer: Self-pay | Admitting: Internal Medicine

## 2019-12-24 MED ORDER — EPINEPHRINE 0.3 MG/0.3ML IJ SOAJ: INTRAMUSCULAR | 11 refills | Status: AC

## 2019-12-24 NOTE — Telephone Encounter (Signed)
Epi pen refill sent to requested pharmacy per Patient request.

## 2019-12-24 NOTE — Telephone Encounter (Signed)
Routing to injection pool for follow-up as this deals with biologic injections in office.  Elisha Headland, FNP

## 2019-12-26 NOTE — Telephone Encounter (Signed)
Xolair was nor received in office as scheduled.  Will call [facility] to follow up with shipment and Patient consent.

## 2019-12-30 ENCOUNTER — Other Ambulatory Visit: Payer: Self-pay | Admitting: Family Medicine

## 2019-12-30 DIAGNOSIS — R209 Unspecified disturbances of skin sensation: Secondary | ICD-10-CM

## 2019-12-31 NOTE — Telephone Encounter (Signed)
Xolair Prefilled Syringe Order: 150mg  Prefilled Syringe:  #4 75mg  Prefilled Syringe: #2 Ordered Date: 12/31/19 Expected date of arrival: 01/01/20 Ordered by: Colten Desroches,LPN [facility]

## 2020-01-01 NOTE — Telephone Encounter (Signed)
Xolair Prefilled Syringe Received:  150mg  Prefilled Syringe >> quantity #4, lot # , exp date 08/15/2020 75mg  Prefilled Syringe >> quantity #2, lot # [id number]/17/[id number], exp date 12/15/2020 Medication arrival date: 01/01/20 Received by: Aleiah Mohammed,LPN

## 2020-01-01 NOTE — Telephone Encounter (Signed)
Called and spoke with Patient. Patient scheduled Xolair new start 02/02/20. Patient requested morning appointment after 01/17/2020. Office policy reviewed. Patient stated she has Epipen and understood to bring to all injections. Nothing further at this time.

## 2020-01-02 ENCOUNTER — Ambulatory Visit (HOSPITAL_COMMUNITY): Payer: BC Managed Care – PPO

## 2020-01-03 NOTE — Assessment & Plan Note (Signed)
Key is watching for progression. Plan- f/u HRCT

## 2020-01-03 NOTE — Assessment & Plan Note (Addendum)
Moderate obstruction with underlying bronchiectasis, Hyper IgE, Hyper Eos. Plan- continue to encourage trial of a Biologic. Xolair was the first discussed. There is a language barrier, but I think bigger problem is misdirection advice she gets from relatives in [place].  Plan- continue education effort. Consider changing recommendation to Dupixent- would need to start educating all over again. Give sample trial Breztri.

## 2020-01-20 ENCOUNTER — Ambulatory Visit (HOSPITAL_COMMUNITY): Payer: BC Managed Care – PPO

## 2020-01-26 ENCOUNTER — Ambulatory Visit (HOSPITAL_COMMUNITY)
Admission: RE | Admit: 2020-01-26 | Discharge: 2020-01-26 | Disposition: A | Discharge status: Home / Self Care | Payer: 59 | Source: Ambulatory Visit | Attending: Family Medicine | Admitting: Family Medicine

## 2020-01-26 DIAGNOSIS — R209 Unspecified disturbances of skin sensation: Secondary | ICD-10-CM | POA: Diagnosis present

## 2020-02-02 ENCOUNTER — Ambulatory Visit: Payer: BC Managed Care – PPO

## 2020-02-17 ENCOUNTER — Ambulatory Visit (INDEPENDENT_AMBULATORY_CARE_PROVIDER_SITE_OTHER): Payer: 59

## 2020-02-17 ENCOUNTER — Other Ambulatory Visit: Payer: Self-pay

## 2020-02-17 DIAGNOSIS — J4550 Severe persistent asthma, uncomplicated: Secondary | ICD-10-CM

## 2020-02-17 MED ORDER — OMALIZUMAB 75 MG/0.5ML ~~LOC~~ SOSY
75.0000 mg | PREFILLED_SYRINGE | Freq: Once | SUBCUTANEOUS | Status: AC
  Administered 2020-02-17: 75 mg via SUBCUTANEOUS

## 2020-02-17 MED ORDER — OMALIZUMAB 150 MG/ML ~~LOC~~ SOSY
300.0000 mg | PREFILLED_SYRINGE | Freq: Once | SUBCUTANEOUS | Status: AC
  Administered 2020-02-17: 300 mg via SUBCUTANEOUS

## 2020-02-17 NOTE — Progress Notes (Signed)
Patient presented to the office today for first-time Xolair injection.  Primary Pulmonologist: Jetty Duhamel MD Medication name: Xolair Strength: 375mg   Site(s):  1 injection right arm and 2 injections left arm  Epi pen/Auvi-Q visible during appointment: Yes  Time of injection: 1400  Patient evaluated every 15-20 minutes per protocol x2 hours.  1st check: 1415 Evaluation: Patient sitting quietly. Patient denies any side or adverse effects.  2nd check: 1430  Evaluation: Sitting quietly in chair.  Patient denies any side or adverse effects.  3rd check: 1445  Evaluation: Patient is sitting quietly in chair. Patient denies any side or adverse effects.  4th check: 1500   Evaluation: Patient sitting quietly in chair. Patient denies any side or adverse effects. No redness or swelling at any right or left injection sites.  5th check: 1515  Evaluation: Patient sitting quietly in chair.  Patient denies any side or adverse effects.  6th check: 1530  Evaluation: Patient sitting quietly.  Patient denies any problems.  7th check: 1600  Evaluation: Patient denies any side or adverse effects. No redness or swelling at right or left injection sites.   Patient very anxious about starting Xolair injections. Patient was shown how to properly administer Epipen with demo pen. Patient returned demonstration with Epipen demo pen.  Reviewed when to seek emergency care and side effects to be aware of. Questions regarding injections, Xolair, and Epi pen answered. Patient scheduled next Xolair injection.

## 2020-02-26 ENCOUNTER — Other Ambulatory Visit: Payer: Self-pay | Admitting: Internal Medicine

## 2020-03-02 ENCOUNTER — Other Ambulatory Visit: Payer: Self-pay

## 2020-03-02 ENCOUNTER — Ambulatory Visit (INDEPENDENT_AMBULATORY_CARE_PROVIDER_SITE_OTHER): Payer: 59

## 2020-03-02 DIAGNOSIS — J4550 Severe persistent asthma, uncomplicated: Secondary | ICD-10-CM | POA: Diagnosis not present

## 2020-03-02 MED ORDER — OMALIZUMAB 150 MG/ML ~~LOC~~ SOSY
300.0000 mg | PREFILLED_SYRINGE | Freq: Once | SUBCUTANEOUS | Status: AC
  Administered 2020-03-02: 300 mg via SUBCUTANEOUS

## 2020-03-02 MED ORDER — OMALIZUMAB 75 MG/0.5ML ~~LOC~~ SOSY
75.0000 mg | PREFILLED_SYRINGE | Freq: Once | SUBCUTANEOUS | Status: AC
  Administered 2020-03-02: 75 mg via SUBCUTANEOUS

## 2020-03-02 NOTE — Progress Notes (Signed)
Have you been hospitalized within the last 10 days?  No Do you have a fever?  No Do you have a cough?  No Do you have a headache or sore throat? No Do you have your Epi Pen visible and is it within date?  Yes   Patient observed 30 minutes after Xolair injections. Patient denies any side or adverse effects.

## 2020-03-08 ENCOUNTER — Telehealth: Payer: Self-pay | Admitting: Internal Medicine

## 2020-03-08 NOTE — Telephone Encounter (Signed)
Xolair Prefilled Syringe Order: 150mg  Prefilled Syringe:  #4 75mg  Prefilled Syringe: #2 Ordered Date: 03/08/20 Expected date of arrival: 03/11/20 Ordered by: Nychelle Cassata,LPN [facility]

## 2020-03-11 NOTE — Telephone Encounter (Signed)
Xolair Prefilled Syringe Received:  150mg  Prefilled Syringe >> quantity #4, lot # , exp date 12-15-20 75mg  Prefilled Syringe >> quantity #2, lot # 12-17-20, exp date 07-15-2020 Medication arrival date: 03/11/20 Received by: Journee Kohen,LPN

## 2020-03-16 ENCOUNTER — Ambulatory Visit (INDEPENDENT_AMBULATORY_CARE_PROVIDER_SITE_OTHER): Payer: 59

## 2020-03-16 ENCOUNTER — Other Ambulatory Visit: Payer: Self-pay

## 2020-03-16 DIAGNOSIS — J4550 Severe persistent asthma, uncomplicated: Secondary | ICD-10-CM

## 2020-03-16 MED ORDER — OMALIZUMAB 150 MG/ML ~~LOC~~ SOSY
300.0000 mg | PREFILLED_SYRINGE | Freq: Once | SUBCUTANEOUS | Status: AC
  Administered 2020-03-16: 300 mg via SUBCUTANEOUS

## 2020-03-16 MED ORDER — OMALIZUMAB 75 MG/0.5ML ~~LOC~~ SOSY
75.0000 mg | PREFILLED_SYRINGE | Freq: Once | SUBCUTANEOUS | Status: AC
  Administered 2020-03-16: 75 mg via SUBCUTANEOUS

## 2020-03-16 NOTE — Progress Notes (Signed)
Have you been hospitalized within the last 10 days?  No Do you have a fever?  No Do you have a cough?  No Do you have a headache or sore throat? No Do you have your Epi Pen visible and is it within date?  Yes   Patient observed 30 minutes  and denies any problems.

## 2020-03-19 ENCOUNTER — Ambulatory Visit: Payer: BC Managed Care – PPO | Admitting: Internal Medicine

## 2020-03-23 ENCOUNTER — Telehealth: Payer: Self-pay | Admitting: Gastroenterology

## 2020-03-23 NOTE — Telephone Encounter (Signed)
Ok, please schedule next available appointment.  

## 2020-03-23 NOTE — Telephone Encounter (Signed)
Good afternoon Dr. Lavon Paganini, patient called wanting to schedule a follow-up visit with you.  She did see another GI specialist in 2019 for an EGD but prefers to see a female provider.  Would it be ok to schedule with you?

## 2020-03-24 ENCOUNTER — Other Ambulatory Visit (HOSPITAL_COMMUNITY): Payer: Self-pay | Admitting: Pharmacy Technician

## 2020-03-24 NOTE — Telephone Encounter (Signed)
Patient scheduled for 05/20/20.

## 2020-03-29 ENCOUNTER — Telehealth: Payer: Self-pay | Admitting: Internal Medicine

## 2020-03-29 NOTE — Telephone Encounter (Signed)
Submitted a Prior Authorization request to [facility] for [employer] via FAX. Will update once we receive a response.   FAX# [phone] PHONE# [phone]

## 2020-03-29 NOTE — Telephone Encounter (Signed)
Routing to infusion center scheduler

## 2020-03-30 ENCOUNTER — Ambulatory Visit (INDEPENDENT_AMBULATORY_CARE_PROVIDER_SITE_OTHER): Payer: 59

## 2020-03-30 ENCOUNTER — Other Ambulatory Visit: Payer: Self-pay

## 2020-03-30 VITALS — BP 104/62 | HR 75 | Temp 98.0°F

## 2020-03-30 DIAGNOSIS — J449 Chronic obstructive pulmonary disease, unspecified: Secondary | ICD-10-CM | POA: Diagnosis not present

## 2020-03-30 MED ORDER — EPINEPHRINE 0.3 MG/0.3ML IJ SOAJ: 0.3000 mg | Freq: Once | INTRAMUSCULAR | Status: DC | PRN

## 2020-03-30 MED ORDER — DIPHENHYDRAMINE HCL 50 MG/ML IJ SOLN: 50.0000 mg | Freq: Once | INTRAMUSCULAR | Status: DC | PRN

## 2020-03-30 MED ORDER — METHYLPREDNISOLONE SODIUM SUCC 125 MG IJ SOLR: 125.0000 mg | Freq: Once | INTRAMUSCULAR | Status: DC | PRN

## 2020-03-30 MED ORDER — OMALIZUMAB 75 MG/0.5ML ~~LOC~~ SOSY
375.0000 mg | PREFILLED_SYRINGE | Freq: Once | SUBCUTANEOUS | Status: AC
  Administered 2020-03-30: 375 mg via SUBCUTANEOUS
  Filled 2020-03-30: qty 0.5

## 2020-03-30 MED ORDER — FAMOTIDINE IN NACL 20-0.9 MG/50ML-% IV SOLN: 20.0000 mg | Freq: Once | INTRAVENOUS | Status: DC | PRN

## 2020-03-30 MED ORDER — ALBUTEROL SULFATE HFA 108 (90 BASE) MCG/ACT IN AERS: 2.0000 | INHALATION_SPRAY | Freq: Once | RESPIRATORY_TRACT | Status: DC | PRN

## 2020-03-30 MED ORDER — SODIUM CHLORIDE 0.9 % IV SOLN: Freq: Once | INTRAVENOUS | Status: DC | PRN

## 2020-03-31 NOTE — Progress Notes (Addendum)
Diagnosis: Asthma  Provider:  Praveen Mannam, MD  Procedure: Injection  Xolair (Omalizumab), Dose: 375 mg, Site: subcutaneous  Discharge: Condition: Good, Destination: Home . AVS provided to patient.   Performed by:  Zenith Kercheval, RN        

## 2020-04-12 ENCOUNTER — Telehealth: Payer: Self-pay | Admitting: Internal Medicine

## 2020-04-12 NOTE — Telephone Encounter (Signed)
[facility] needs documentation of improvement on Xolair: fewer or less severe asthma symptoms and attacks to renew authorization  Patient's authorization expires on 04/28/20, we will be able to get 2 additional doses on 04/26/20, that will provide treatment for patient till 05/26/20. Patient has an office visit on 05/10/20 with Dr. Delton Coombes. Pharmacy will submit a new PA request after this visit.

## 2020-04-12 NOTE — Telephone Encounter (Signed)
[facility] is still working on reviewing the appeal for [employer]. Will update once I get a response.

## 2020-04-12 NOTE — Telephone Encounter (Signed)
Routing to infusion team, as they are working on PA/appeal

## 2020-04-12 NOTE — Telephone Encounter (Signed)
Are you sure she is coming to see me? It looks like she has an OV with CY.

## 2020-04-12 NOTE — Telephone Encounter (Signed)
Yes, message is for Dr. Maple Hudson. Thank you for routing it.

## 2020-04-13 NOTE — Telephone Encounter (Signed)
Attempted to call pt but unable to reach. Left message for her to return call.  When pt returns call, please make her a sooner appt with CY and CY said if needed, held slot can be used. Pt's appt really needs to be prior to 04/28/20 as that is when the current PA for her Xolair is expiring.

## 2020-04-13 NOTE — Telephone Encounter (Signed)
Ok to use a held spot to get her in with me sooner - need to be before the time limit indicated by infusion center

## 2020-04-14 ENCOUNTER — Ambulatory Visit (INDEPENDENT_AMBULATORY_CARE_PROVIDER_SITE_OTHER): Payer: 59

## 2020-04-14 ENCOUNTER — Other Ambulatory Visit: Payer: Self-pay

## 2020-04-14 VITALS — BP 108/66 | HR 74 | Temp 98.9°F | Resp 16

## 2020-04-14 DIAGNOSIS — J449 Chronic obstructive pulmonary disease, unspecified: Secondary | ICD-10-CM

## 2020-04-14 DIAGNOSIS — J4489 Other specified chronic obstructive pulmonary disease: Secondary | ICD-10-CM

## 2020-04-14 MED ORDER — FAMOTIDINE IN NACL 20-0.9 MG/50ML-% IV SOLN: 20.0000 mg | Freq: Once | INTRAVENOUS | Status: DC | PRN

## 2020-04-14 MED ORDER — METHYLPREDNISOLONE SODIUM SUCC 125 MG IJ SOLR: 125.0000 mg | Freq: Once | INTRAMUSCULAR | Status: DC | PRN

## 2020-04-14 MED ORDER — OMALIZUMAB 75 MG/0.5ML ~~LOC~~ SOSY
375.0000 mg | PREFILLED_SYRINGE | Freq: Once | SUBCUTANEOUS | Status: AC
  Administered 2020-04-14: 375 mg via SUBCUTANEOUS
  Filled 2020-04-14: qty 0.5

## 2020-04-14 MED ORDER — SODIUM CHLORIDE 0.9 % IV SOLN: Freq: Once | INTRAVENOUS | Status: DC | PRN

## 2020-04-14 MED ORDER — DIPHENHYDRAMINE HCL 50 MG/ML IJ SOLN: 50.0000 mg | Freq: Once | INTRAMUSCULAR | Status: DC | PRN

## 2020-04-14 MED ORDER — EPINEPHRINE 0.3 MG/0.3ML IJ SOAJ: 0.3000 mg | Freq: Once | INTRAMUSCULAR | Status: DC | PRN

## 2020-04-14 MED ORDER — ALBUTEROL SULFATE HFA 108 (90 BASE) MCG/ACT IN AERS: 2.0000 | INHALATION_SPRAY | Freq: Once | RESPIRATORY_TRACT | Status: DC | PRN

## 2020-04-14 NOTE — Progress Notes (Signed)
Diagnosis: Asthma  Provider:Mannam Praveen   Procedure: Injection, Medication: Xolair Site: subcutaneous x1 Lt arm, x2 Rt arm  by Garnette Czech, RN  Discharge: Condition: Good, Destination: Home . AVS provided. by Garnette Czech, RN

## 2020-04-16 NOTE — Telephone Encounter (Signed)
Per patient's chart, she has been scheduled for 04/21/20 at 1130am with [facility]. Will close encounter.

## 2020-04-19 NOTE — Progress Notes (Signed)
Diagnosis: Asthma  Provider:  Praveen Mannam, MD  Procedure: Injection  Xolair (Omalizumab), Dose: 375 mg, Site: subcutaneous  Discharge: Condition: Good, Destination: Home . AVS provided to patient.   Performed by:  Alyssamae Klinck, RN        

## 2020-04-20 NOTE — Progress Notes (Signed)
Subjective:    Patient ID: Sara Trevino, female    DOB: [birth year], 55 y.o.   MRN: [id number]  HPI  55 year old female never smoker followed for COPD mixed type/chronic cough, allergic rhinitis, nasal polyps Allergy Vaccine-Dr. Candor Callas x 3 years w/o clear benefit CT chest 11/2013 clear lungs  CT sinus 12/2013 neg for acute process  Allergy profile 07/21/14- Total IgE 249  Elevated for dust mite. Grass pollens, tree pollens CBC w diff 07/21/14- EOS  13.6 H Allergy skin test 04/21/11 elevated dog, weed pollens, tree pollens molds  She complains primarily of coughing 5 years. Occasional white or yellow sputum. 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 Does not recognize association between symptoms and workplace Had seen Dr. Lyda Jester for second opinion and he also thought Nucala might be appropriate She was not interested in [place] because it is "too new". PFT 02/23/14 moderate obstructive airways disease, significant response to bronchodilator, air trapping, normal diffusion, normal TLC. FEV1/FVC 0.68,  She was seen at [facility] by ENT/ Dr Senior who identified nasal polyps and has scheduled a CT of sinuses and recommended bilateral endoscopic sinus surgery Quantiferon Gold TB assay Positive 2018  PFT 11/02/16-moderately severe obstructive airways disease with insignificant response to bronchodilator. Diffusion slightly reduced ----------------------------------------------------------------------------------------------r.   12/05/19- 55 year old female never smoker( + second-hand) followed for / AsthmaCOPD mixed type/ Bronchiectasis, chronic cough, allergic rhinitis, nasal polyps, + Quantiferon Gold/ culture Neg Elevated IgE and Eosinophils, but she has not wanted to try Biologics Symbicort 160, ProAir hfa (preferred Symbicort over Trelegy). Covid vax- 2 Moderna Flu vax- Had -----Wants to talk about Xolair  injections, last month was having increased shortness of breath and got steroid injection from PCP, productive cough with yellow mucus Still has never started Xolair.  She says she usually has needed steroid inj about 4x/ year, but this year only twice. Reflects masking, work from home. Using rescue hfa 1-2 x/ week.  04/21/20- 55 year old female never smoker( + second-hand) followed for / AsthmaCOPD mixed type/ Bronchiectasis, chronic cough, allergic rhinitis, nasal polyps, + Quantiferon Gold/ culture Neg Elevated IgE and Eosinophils, Started Xolair 12/05/19 - Proair hfa, Symbicort 160, Xolair, Covid vax-3 Moderna Flu vax-had Very much better since starting Xolair injections. No problem with reactions. Significant reduction in chronic cough, little wheeze. She continued Symbicort, but not needing rescue inhaler. She is very pleased with Xolair.  HRCT chest 12/15/19- IMPRESSION: 1. Mild cylindrical bronchiectasis with scattered areas of mild mucoid impaction. 2. Aortic atherosclerosis. Aortic Atherosclerosis (ICD10-I70.0). ROS-see HPI  + = positive  Constitutional:    weight loss, night sweats, fevers, chills, fatigue, lassitude. HEENT:    headaches, difficulty swallowing, tooth/dental problems, +sore throat,       +sneezing, +itching, ear ache, +nasal congestion, post nasal drip, snoring CV:    chest pain, orthopnea, PND, swelling in lower extremities, anasarca,                                                     dizziness, palpitations Resp:   +shortness of breath with exertion or at rest.                productive cough,   +non-productive cough, coughing up of blood.  change in color of mucus.   wheezing.   Skin:    rash or lesions. GI:  No-   heartburn, indigestion, abdominal pain, nausea, vomiting, diarrhea,                 change in bowel habits, loss of appetite GU: dysuria, change in color of urine, no urgency or frequency.   flank pain. MS:   joint pain, +stiffness,  decreased range of motion, back pain. Neuro-     nothing unusual Psych:  change in mood or affect.  depression or anxiety.   memory loss.    Objective:   Physical Exam OBJ- Physical Exam General- Alert, Oriented, Affect-appropriate, Distress- none acute, + thin Skin- rash-none, lesions- none, excoriation- none Lymphadenopathy- none Head- atraumatic            Eyes- Gross vision intact, PERRLA, conjunctivae and secretions clear            Ears- Hearing, canals-normal            Nose- + Mild turbinate edema, no-Septal dev, mucus, polyps, erosion, perforation             Throat- Mallampati II , mucosa clear , drainage- none, tonsils- atrophic Neck- flexible , trachea midline, no stridor , thyroid nl, carotid no bruit Chest - symmetrical excursion , unlabored           Heart/CV- RRR , no murmur , no gallop  , no rub, nl s1 s2                           - JVD- none , edema- none, stasis changes- none, varices- none           Lung-  Cough-none,  congested rhonchi- none, wheeze-none , dullness-none, rub- none           Chest wall-  Abd-  Br/ Gen/ Rectal- Not done, not indicated Extrem- cyanosis- none, clubbing, none, atrophy- none, strength- nl Neuro- grossly intact to observation     Assessment & Plan:

## 2020-04-21 ENCOUNTER — Ambulatory Visit: Payer: 59 | Admitting: Internal Medicine

## 2020-04-21 ENCOUNTER — Other Ambulatory Visit: Payer: Self-pay

## 2020-04-21 ENCOUNTER — Encounter: Payer: Self-pay | Admitting: Internal Medicine

## 2020-04-21 VITALS — BP 112/64 | HR 67 | Temp 98.2°F | Ht 62.0 in | Wt 94.0 lb

## 2020-04-21 DIAGNOSIS — J479 Bronchiectasis, uncomplicated: Secondary | ICD-10-CM | POA: Diagnosis not present

## 2020-04-21 DIAGNOSIS — J449 Chronic obstructive pulmonary disease, unspecified: Secondary | ICD-10-CM

## 2020-04-21 DIAGNOSIS — J4550 Severe persistent asthma, uncomplicated: Secondary | ICD-10-CM

## 2020-04-21 LAB — CBC WITH DIFFERENTIAL/PLATELET
Basophils Absolute: 0 10*3/uL (ref 0.0–0.1)
Basophils Relative: 1.1 % (ref 0.0–3.0)
Eosinophils Absolute: 0.3 10*3/uL (ref 0.0–0.7)
Eosinophils Relative: 8.1 % — ABNORMAL HIGH (ref 0.0–5.0)
HCT: 37.7 % (ref 36.0–46.0)
Hemoglobin: 12.5 g/dL (ref 12.0–15.0)
Lymphocytes Relative: 31.1 % (ref 12.0–46.0)
Lymphs Abs: 1.1 10*3/uL (ref 0.7–4.0)
MCHC: 33.2 g/dL (ref 30.0–36.0)
MCV: 96.3 fl (ref 78.0–100.0)
Monocytes Absolute: 0.3 10*3/uL (ref 0.1–1.0)
Monocytes Relative: 7.8 % (ref 3.0–12.0)
Neutro Abs: 1.8 10*3/uL (ref 1.4–7.7)
Neutrophils Relative %: 51.9 % (ref 43.0–77.0)
Platelets: 145 10*3/uL — ABNORMAL LOW (ref 150.0–400.0)
RBC: 3.91 Mil/uL (ref 3.87–5.11)
RDW: 14.2 % (ref 11.5–15.5)
WBC: 3.4 10*3/uL — ABNORMAL LOW (ref 4.0–10.5)

## 2020-04-21 NOTE — Assessment & Plan Note (Signed)
She is very pleased with improved cough and wheeze on Xolair. Significant benefit. Plan- continue Xolair. Continue Symbicort but may try reduction to once daily if she stays stable.

## 2020-04-21 NOTE — Patient Instructions (Signed)
Order- lab- CBC w dif    Dx Asthma severe persistent uncomplicated  We can continue Xolair  Please cal if we can help

## 2020-04-21 NOTE — Assessment & Plan Note (Addendum)
Mild on HRCT 12/15/19 Significant atypical infection seems unlikely Plan- continued observation. Order Flutter val e when available

## 2020-04-22 ENCOUNTER — Encounter: Payer: Self-pay | Admitting: *Deleted

## 2020-04-22 NOTE — Progress Notes (Signed)
I called and spoke with the Sara Trevino regarding her labs. She verbalized understanding. She states that she is concerned bc she reviewed her results in mychart and saw that her platelet count and CBC were low. She wants to know what Dr Maple Hudson thinks could have caused this and if there is reason for concern. Please advise, thanks!

## 2020-04-23 ENCOUNTER — Telehealth: Payer: Self-pay | Admitting: Internal Medicine

## 2020-04-23 NOTE — Telephone Encounter (Signed)
Submitted urgent Prior Authorization request renewal to [facility] for XOLAIR PFS via Cover My Meds. Will update once we receive a response. Attached most recent OV note from Dr. Maple Hudson  Key: [id number]

## 2020-04-23 NOTE — Telephone Encounter (Signed)
Faxed last OV note from 04/21/20 with notes highlighted that indicate improvement since starting Xolair Fax: [phone] PA: [phone]

## 2020-04-26 NOTE — Telephone Encounter (Signed)
Received notification from [facility] regarding a prior authorization for XOLAIR. Authorization has been APPROVED from 04/23/20 to 04/23/21.   Authorization #  Phone # [phone]

## 2020-04-28 ENCOUNTER — Ambulatory Visit (INDEPENDENT_AMBULATORY_CARE_PROVIDER_SITE_OTHER): Payer: 59

## 2020-04-28 ENCOUNTER — Other Ambulatory Visit: Payer: Self-pay

## 2020-04-28 VITALS — BP 117/76 | HR 62 | Temp 98.2°F | Resp 20

## 2020-04-28 DIAGNOSIS — J449 Chronic obstructive pulmonary disease, unspecified: Secondary | ICD-10-CM

## 2020-04-28 DIAGNOSIS — J4489 Other specified chronic obstructive pulmonary disease: Secondary | ICD-10-CM

## 2020-04-28 MED ORDER — FAMOTIDINE IN NACL 20-0.9 MG/50ML-% IV SOLN: 20.0000 mg | Freq: Once | INTRAVENOUS | Status: DC | PRN

## 2020-04-28 MED ORDER — ALBUTEROL SULFATE HFA 108 (90 BASE) MCG/ACT IN AERS: 2.0000 | INHALATION_SPRAY | Freq: Once | RESPIRATORY_TRACT | Status: DC | PRN

## 2020-04-28 MED ORDER — DIPHENHYDRAMINE HCL 50 MG/ML IJ SOLN
50.0000 mg | Freq: Once | INTRAMUSCULAR | Status: DC | PRN
Start: 2020-04-28 — End: 2020-04-28

## 2020-04-28 MED ORDER — SODIUM CHLORIDE 0.9 % IV SOLN: Freq: Once | INTRAVENOUS | Status: DC | PRN

## 2020-04-28 MED ORDER — METHYLPREDNISOLONE SODIUM SUCC 125 MG IJ SOLR: 125.0000 mg | Freq: Once | INTRAMUSCULAR | Status: DC | PRN

## 2020-04-28 MED ORDER — EPINEPHRINE 0.3 MG/0.3ML IJ SOAJ: 0.3000 mg | Freq: Once | INTRAMUSCULAR | Status: DC | PRN

## 2020-04-28 MED ORDER — OMALIZUMAB 75 MG/0.5ML ~~LOC~~ SOSY
375.0000 mg | PREFILLED_SYRINGE | Freq: Once | SUBCUTANEOUS | Status: AC
  Administered 2020-04-28: 375 mg via SUBCUTANEOUS
  Filled 2020-04-28: qty 0.5

## 2020-04-28 NOTE — Progress Notes (Signed)
Diagnosis: Asthma  Provider:  Praveen Mannam, MD  Procedure: Injection  Xolair (Omalizumab), Dose: 375 mg, Site: subcutaneous  Discharge: Condition: Good, Destination: Home . AVS provided to patient.   Performed by:  Carter Kassel, RN        

## 2020-05-10 ENCOUNTER — Ambulatory Visit: Payer: 59 | Admitting: Internal Medicine

## 2020-05-11 ENCOUNTER — Other Ambulatory Visit: Payer: Self-pay

## 2020-05-11 ENCOUNTER — Ambulatory Visit (INDEPENDENT_AMBULATORY_CARE_PROVIDER_SITE_OTHER): Payer: 59

## 2020-05-11 VITALS — BP 114/69 | HR 63 | Temp 97.7°F

## 2020-05-11 DIAGNOSIS — J449 Chronic obstructive pulmonary disease, unspecified: Secondary | ICD-10-CM

## 2020-05-11 MED ORDER — EPINEPHRINE 0.3 MG/0.3ML IJ SOAJ: 0.3000 mg | Freq: Once | INTRAMUSCULAR | Status: DC | PRN

## 2020-05-11 MED ORDER — FAMOTIDINE IN NACL 20-0.9 MG/50ML-% IV SOLN: 20.0000 mg | Freq: Once | INTRAVENOUS | Status: DC | PRN

## 2020-05-11 MED ORDER — DIPHENHYDRAMINE HCL 50 MG/ML IJ SOLN: 50.0000 mg | Freq: Once | INTRAMUSCULAR | Status: DC | PRN

## 2020-05-11 MED ORDER — ALBUTEROL SULFATE HFA 108 (90 BASE) MCG/ACT IN AERS: 2.0000 | INHALATION_SPRAY | Freq: Once | RESPIRATORY_TRACT | Status: DC | PRN

## 2020-05-11 MED ORDER — SODIUM CHLORIDE 0.9 % IV SOLN: Freq: Once | INTRAVENOUS | Status: DC | PRN

## 2020-05-11 MED ORDER — OMALIZUMAB 75 MG/0.5ML ~~LOC~~ SOSY
375.0000 mg | PREFILLED_SYRINGE | Freq: Once | SUBCUTANEOUS | Status: AC
  Administered 2020-05-11: 375 mg via SUBCUTANEOUS
  Filled 2020-05-11: qty 0.5

## 2020-05-11 MED ORDER — METHYLPREDNISOLONE SODIUM SUCC 125 MG IJ SOLR: 125.0000 mg | Freq: Once | INTRAMUSCULAR | Status: DC | PRN

## 2020-05-11 NOTE — Progress Notes (Signed)
Diagnosis: Asthma  Provider:  Praveen Mannam, MD  Procedure: Injection  Xolair (Omalizumab), Dose: 375 mg, Site: subcutaneous  Discharge: Condition: Good, Destination: Home . AVS provided to patient.   Performed by:  Osborn Pullin, RN        

## 2020-05-12 ENCOUNTER — Ambulatory Visit: Payer: 59

## 2020-05-20 ENCOUNTER — Other Ambulatory Visit: Payer: 59

## 2020-05-20 ENCOUNTER — Encounter: Payer: Self-pay | Admitting: Gastroenterology

## 2020-05-20 ENCOUNTER — Ambulatory Visit: Payer: 59 | Admitting: Gastroenterology

## 2020-05-20 VITALS — BP 98/62 | HR 78 | Ht 62.0 in | Wt 94.5 lb

## 2020-05-20 DIAGNOSIS — R1012 Left upper quadrant pain: Secondary | ICD-10-CM

## 2020-05-20 DIAGNOSIS — K219 Gastro-esophageal reflux disease without esophagitis: Secondary | ICD-10-CM | POA: Diagnosis not present

## 2020-05-20 DIAGNOSIS — R1011 Right upper quadrant pain: Secondary | ICD-10-CM

## 2020-05-20 MED ORDER — PANTOPRAZOLE SODIUM 40 MG PO TBEC: 40.0000 mg | DELAYED_RELEASE_TABLET | Freq: Every day | ORAL | 3 refills | Status: DC

## 2020-05-20 NOTE — Patient Instructions (Addendum)
You have been scheduled for an complete abdominal ultrasound at [facility] (1st floor of hospital) on May 11 th   at 9:00am  . Please arrive 15 minutes prior to your appointment for registration. Make certain not to have anything to eat or drink 8  hours prior to your appointment. Should you need to reschedule your appointment, please contact radiology at [phone]. This test typically takes about 30 minutes to perform.  Your provider has requested that you go to the basement level for lab work before leaving today. Press "B" on the elevator. The lab is located at the first door on the left as you exit the elevator. (Stool Test Kit)   Conn's Current Therapy 2021 (pp. 213-216). [place], [place]: Elsevier.">  Gastroesophageal Reflux Disease, Adult Gastroesophageal reflux (GER) happens when acid from the stomach flows up into the tube that connects the mouth and the stomach (esophagus). Normally, food travels down the esophagus and stays in the stomach to be digested. However, when a person has GER, food and stomach acid sometimes move back up into the esophagus. If this becomes a more serious problem, the person may be diagnosed with a disease called gastroesophageal reflux disease (GERD). GERD occurs when the reflux:  Happens often.  Causes frequent or severe symptoms.  Causes problems such as damage to the esophagus. When stomach acid comes in contact with the esophagus, the acid may cause inflammation in the esophagus. Over time, GERD may create small holes (ulcers) in the lining of the esophagus. What are the causes? This condition is caused by a problem with the muscle between the esophagus and the stomach (lower esophageal sphincter, or LES). Normally, the LES muscle closes after food passes through the esophagus to the stomach. When the LES is weakened or abnormal, it does not close properly, and that allows food and stomach acid to go back up into the esophagus. The LES can be  weakened by certain dietary substances, medicines, and medical conditions, including:  Tobacco use.  Pregnancy.  Having a hiatal hernia.  Alcohol use.  Certain foods and beverages, such as coffee, chocolate, onions, and peppermint. What increases the risk? You are more likely to develop this condition if you:  Have an increased body weight.  Have a connective tissue disorder.  Take NSAIDs, such as ibuprofen. What are the signs or symptoms? Symptoms of this condition include:  Heartburn.  Difficult or painful swallowing and the feeling of having a lump in the throat.  A bitter taste in the mouth.  Bad breath and having a large amount of saliva.  Having an upset or bloated stomach and belching.  Chest pain. Different conditions can cause chest pain. Make sure you see your health care provider if you experience chest pain.  Shortness of breath or wheezing.  Ongoing (chronic) cough or a nighttime cough.  Wearing away of tooth enamel.  Weight loss. How is this diagnosed? This condition may be diagnosed based on a medical history and a physical exam. To determine if you have mild or severe GERD, your health care provider may also monitor how you respond to treatment. You may also have tests, including:  A test to examine your stomach and esophagus with a small camera (endoscopy).  A test that measures the acidity level in your esophagus.  A test that measures how much pressure is on your esophagus.  A barium swallow or modified barium swallow test to show the shape, size, and functioning of your esophagus. How is  this treated? Treatment for this condition may vary depending on how severe your symptoms are. Your health care provider may recommend:  Changes to your diet.  Medicine.  Surgery. The goal of treatment is to help relieve your symptoms and to prevent complications. Follow these instructions at home: Eating and drinking  Follow a diet as recommended by  your health care provider. This may involve avoiding foods and drinks such as: ? Coffee and tea, with or without caffeine. ? Drinks that contain alcohol. ? Energy drinks and sports drinks. ? Carbonated drinks or sodas. ? Chocolate and cocoa. ? Peppermint and mint flavorings. ? Garlic and onions. ? Horseradish. ? Spicy and acidic foods, including peppers, chili powder, curry powder, vinegar, hot sauces, and barbecue sauce. ? Citrus fruit juices and citrus fruits, such as oranges, lemons, and limes. ? Tomato-based foods, such as red sauce, chili, salsa, and pizza with red sauce. ? Fried and fatty foods, such as donuts, french fries, potato chips, and high-fat dressings. ? High-fat meats, such as hot dogs and fatty cuts of red and white meats, such as rib eye steak, sausage, ham, and bacon. ? High-fat dairy items, such as whole milk, butter, and cream cheese.  Eat small, frequent meals instead of large meals.  Avoid drinking large amounts of liquid with your meals.  Avoid eating meals during the 2-3 hours before bedtime.  Avoid lying down right after you eat.  Do not exercise right after you eat.   Lifestyle  Do not use any products that contain nicotine or tobacco. These products include cigarettes, chewing tobacco, and vaping devices, such as e-cigarettes. If you need help quitting, ask your health care provider.  Try to reduce your stress by using methods such as yoga or meditation. If you need help reducing stress, ask your health care provider.  If you are overweight, reduce your weight to an amount that is healthy for you. Ask your health care provider for guidance about a safe weight loss goal.   General instructions  Pay attention to any changes in your symptoms.  Take over-the-counter and prescription medicines only as told by your health care provider. Do not take aspirin, ibuprofen, or other NSAIDs unless your health care provider told you to take these medicines.  Wear  loose-fitting clothing. Do not wear anything tight around your waist that causes pressure on your abdomen.  Raise (elevate) the head of your bed about 6 inches (15 cm). You can use a wedge to do this.  Avoid bending over if this makes your symptoms worse.  Keep all follow-up visits. This is important. Contact a health care provider if:  You have: ? New symptoms. ? Unexplained weight loss. ? Difficulty swallowing or it hurts to swallow. ? Wheezing or a persistent cough. ? A hoarse voice.  Your symptoms do not improve with treatment. Get help right away if:  You have sudden pain in your arms, neck, jaw, teeth, or back.  You suddenly feel sweaty, dizzy, or light-headed.  You have chest pain or shortness of breath.  You vomit and the vomit is green, yellow, or black, or it looks like blood or coffee grounds.  You faint.  You have stool that is red, bloody, or black.  You cannot swallow, drink, or eat. These symptoms may represent a serious problem that is an emergency. Do not wait to see if the symptoms will go away. Get medical help right away. Call your local emergency services (911 in the [place] Do not  drive yourself to the hospital. Summary  Gastroesophageal reflux happens when acid from the stomach flows up into the esophagus. GERD is a disease in which the reflux happens often, causes frequent or severe symptoms, or causes problems such as damage to the esophagus.  Treatment for this condition may vary depending on how severe your symptoms are. Your health care provider may recommend diet and lifestyle changes, medicine, or surgery.  Contact a health care provider if you have new or worsening symptoms.  Take over-the-counter and prescription medicines only as told by your health care provider. Do not take aspirin, ibuprofen, or other NSAIDs unless your health care provider told you to do so.  Keep all follow-up visits as told by your health care provider. This is  important. This information is not intended to replace advice given to you by your health care provider. Make sure you discuss any questions you have with your health care provider. Document Revised: 07/14/2019 Document Reviewed: 07/14/2019 Elsevier Patient Education  2021 [employer].  Due to recent changes in healthcare laws, you may see the results of your imaging and laboratory studies on MyChart before your provider has had a chance to review them.  We understand that in some cases there may be results that are confusing or concerning to you. Not all laboratory results come back in the same time frame and the provider may be waiting for multiple results in order to interpret others.  Please give [place] 48 hours in order for your provider to thoroughly review all the results before contacting the office for clarification of your results.    I appreciate the  opportunity to care for you  Thank You   Harl Bowie , MD

## 2020-05-20 NOTE — Progress Notes (Signed)
Sara Trevino    [id number]    [birth year]  Primary Care Physician:Merrell, Elmon Else, MD  Referring Physician: Ozella Rocks, MD [address] [place],  [place] [address]   Chief complaint: Abdominal pain, acid taste  HPI:  55 year old very pleasant female here to reestablish care.  She was previously seen in 2018. She had EGD with 48-hour pH Bravo placement with Dr. Vida Rigger was unremarkable in January 2019  She continues to have intermittent bitter taste specially when she wakes up in the morning and burning sensation in the throat  She is experiencing epigastric and left upper quadrant abdominal pain that radiates to the back and the shoulder.  She had negative cardiac work-up   Colonoscopy June 2018 was normal other than small internal hemorrhoids  CT chest  12/10/19 1. Mild cylindrical bronchiectasis with scattered areas of mild mucoid impaction. 2. Aortic atherosclerosis.  Abdomen ultrasound 07/28/16 Liver: Multiple cysts are identified in the liver, stable compared with prior CT exam. Largest measures 3.4 x 3.2 x 3.2 cm.  IMPRESSION: 1.  No evidence for acute  abnormality. 2. Stable appearance of hepatic cysts.   Outpatient Encounter Medications as of 05/20/2020  Medication Sig  . albuterol (PROAIR HFA) 108 (90 Base) MCG/ACT inhaler INHALE 2 PUFFS INTO THE LUNGS EVERY 6 HOURS AS NEEDED FOR WHEEZING AND FOR SHORTNESS OF BREATH  . budesonide-formoterol (SYMBICORT) 160-4.5 MCG/ACT inhaler TAKE 2 PUFFS BY MOUTH TWICE A DAY  . EPINEPHrine 0.3 mg/0.3 mL IJ SOAJ injection Inject into thigh for severe allergic reaction.  . Multiple Vitamins-Minerals (MULTIVITAMIN PO) Take 1 tablet by mouth daily.  Marland Kitchen omalizumab Geoffry Paradise) 150 MG/ML prefilled syringe Inject 300 mg into the skin every 14 (fourteen) days.  Marland Kitchen Respiratory Therapy Supplies (FLUTTER) DEVI As directed  . [DISCONTINUED] omalizumab Geoffry Paradise) 75 MG/0.5ML prefilled syringe Inject 75 mg into the skin  every 14 (fourteen) days. (Patient not taking: Reported on 05/20/2020)   No facility-administered encounter medications on file as of 05/20/2020.    Allergies as of 05/20/2020 - Review Complete 05/20/2020  Allergen Reaction Noted  . Amoxicillin-pot clavulanate Diarrhea 10/11/2016    Past Medical History:  Diagnosis Date  . Allergy   . Asthma   . Cough   . SOB (shortness of breath)     Past Surgical History:  Procedure Laterality Date  . BRAVO PH STUDY N/A 02/13/2017   Procedure: BRAVO PH STUDY;  Surgeon: Vida Rigger, MD;  Location: [facility];  Service: Endoscopy;  Laterality: N/A;  . COLONOSCOPY    . ESOPHAGOGASTRODUODENOSCOPY (EGD) WITH PROPOFOL N/A 02/13/2017   Procedure: ESOPHAGOGASTRODUODENOSCOPY (EGD) WITH PROPOFOL;  Surgeon: Vida Rigger, MD;  Location: [facility];  Service: Endoscopy;  Laterality: N/A;  . NASAL SINUS SURGERY  2017  . UPPER GASTROINTESTINAL ENDOSCOPY      Family History  Problem Relation Age of Onset  . Positive PPD/TB Exposure Mother   . Colon cancer Neg Hx   . Esophageal cancer Neg Hx   . Rectal cancer Neg Hx   . Stomach cancer Neg Hx     Social History   Socioeconomic History  . Marital status: Married    Spouse name: Not on file  . Number of children: 1  . Years of education: Not on file  . Highest education level: Not on file  Occupational History    Employer: [employer]    Comment: chemister  Tobacco Use  . Smoking status: Never Smoker  .  Smokeless tobacco: Never Used  Vaping Use  . Vaping Use: Never used  Substance and Sexual Activity  . Alcohol use: No  . Drug use: No  . Sexual activity: Not on file  Other Topics Concern  . Not on file  Social History Narrative  . Not on file   Social Determinants of Health   Financial Resource Strain: Not on file  Food Insecurity: Not on file  Transportation Needs: Not on file  Physical Activity: Not on file  Stress: Not on file  Social Connections: Not on file  Intimate Partner  Violence: Not on file      Review of systems: All other review of systems negative except as mentioned in the HPI.   Physical Exam: Vitals:   05/20/20 0945  BP: 98/62  Pulse: 78   Body mass index is 17.28 kg/m. Gen:      No acute distress HEENT:  sclera anicteric Abd:      soft, non-tender; no palpable masses, no distension Ext:    No edema Neuro: alert and oriented x 3 Psych: normal mood and affect  Data Reviewed:  Reviewed labs, radiology imaging, old records and pertinent past GI work up   Assessment and Plan/Recommendations:  55 year old very pleasant female with complaints of upper abdominal pain radiating to the back, acid taste and sore throat Will start pantoprazole 40 mg daily, 30 minutes before breakfast Antireflux measures Check H. pylori stool antigen If continues to have persistent symptoms will consider EGD for further evaluation  Obtain abdominal ultrasound for further evaluation of upper abdominal pain, to exclude gallbladder disease and to follow-up liver cysts which were benign appearing on last ultrasound    [name]  Sara Trevino , MD    CC: Ozella Rocks, MD

## 2020-05-24 ENCOUNTER — Other Ambulatory Visit: Payer: 59

## 2020-05-24 DIAGNOSIS — K219 Gastro-esophageal reflux disease without esophagitis: Secondary | ICD-10-CM

## 2020-05-24 DIAGNOSIS — R1011 Right upper quadrant pain: Secondary | ICD-10-CM

## 2020-05-24 DIAGNOSIS — R1012 Left upper quadrant pain: Secondary | ICD-10-CM

## 2020-05-25 LAB — HELICOBACTER PYLORI  SPECIAL ANTIGEN
MICRO NUMBER:: 11866069
SPECIMEN QUALITY: ADEQUATE

## 2020-05-26 ENCOUNTER — Ambulatory Visit: Payer: 59

## 2020-05-26 ENCOUNTER — Other Ambulatory Visit: Payer: Self-pay

## 2020-05-26 ENCOUNTER — Telehealth: Payer: Self-pay | Admitting: Internal Medicine

## 2020-05-26 ENCOUNTER — Ambulatory Visit (HOSPITAL_BASED_OUTPATIENT_CLINIC_OR_DEPARTMENT_OTHER)
Admission: RE | Admit: 2020-05-26 | Discharge: 2020-05-26 | Disposition: A | Discharge status: Home / Self Care | Payer: 59 | Source: Ambulatory Visit | Attending: Gastroenterology | Admitting: Gastroenterology

## 2020-05-26 DIAGNOSIS — R1011 Right upper quadrant pain: Secondary | ICD-10-CM

## 2020-05-26 DIAGNOSIS — R1012 Left upper quadrant pain: Secondary | ICD-10-CM | POA: Diagnosis present

## 2020-05-26 DIAGNOSIS — K219 Gastro-esophageal reflux disease without esophagitis: Secondary | ICD-10-CM | POA: Diagnosis present

## 2020-05-26 NOTE — Telephone Encounter (Addendum)
Patient may r/s Xolair to when she is feeling better. She was provided with phone number to r/s Xolair to next week [phone] )  Routing to Johaura to cancel her Xolair appt at [facility] for today. She will likely need subsequent appts cancelled   Chesley Mires, PharmD, MPH Clinical Pharmacist (Rheumatology and Pulmonology)

## 2020-05-27 ENCOUNTER — Ambulatory Visit (HOSPITAL_COMMUNITY): Payer: 59

## 2020-05-31 ENCOUNTER — Encounter: Payer: Self-pay | Admitting: Gastroenterology

## 2020-06-01 ENCOUNTER — Ambulatory Visit (INDEPENDENT_AMBULATORY_CARE_PROVIDER_SITE_OTHER): Payer: 59

## 2020-06-01 ENCOUNTER — Other Ambulatory Visit: Payer: Self-pay

## 2020-06-01 VITALS — BP 119/69 | HR 69 | Temp 97.7°F | Resp 16

## 2020-06-01 DIAGNOSIS — J449 Chronic obstructive pulmonary disease, unspecified: Secondary | ICD-10-CM | POA: Diagnosis not present

## 2020-06-01 MED ORDER — SODIUM CHLORIDE 0.9 % IV SOLN: Freq: Once | INTRAVENOUS | Status: DC | PRN

## 2020-06-01 MED ORDER — EPINEPHRINE 0.3 MG/0.3ML IJ SOAJ: 0.3000 mg | Freq: Once | INTRAMUSCULAR | Status: DC | PRN

## 2020-06-01 MED ORDER — OMALIZUMAB 75 MG/0.5ML ~~LOC~~ SOSY
375.0000 mg | PREFILLED_SYRINGE | Freq: Once | SUBCUTANEOUS | Status: AC
  Administered 2020-06-01: 375 mg via SUBCUTANEOUS

## 2020-06-01 MED ORDER — DIPHENHYDRAMINE HCL 50 MG/ML IJ SOLN: 50.0000 mg | Freq: Once | INTRAMUSCULAR | Status: DC | PRN

## 2020-06-01 MED ORDER — FAMOTIDINE IN NACL 20-0.9 MG/50ML-% IV SOLN: 20.0000 mg | Freq: Once | INTRAVENOUS | Status: DC | PRN

## 2020-06-01 MED ORDER — ALBUTEROL SULFATE HFA 108 (90 BASE) MCG/ACT IN AERS: 2.0000 | INHALATION_SPRAY | Freq: Once | RESPIRATORY_TRACT | Status: DC | PRN

## 2020-06-01 MED ORDER — METHYLPREDNISOLONE SODIUM SUCC 125 MG IJ SOLR: 125.0000 mg | Freq: Once | INTRAMUSCULAR | Status: DC | PRN

## 2020-06-01 NOTE — Progress Notes (Signed)
Diagnosis: Asthma  Provider:  Chilton Greathouse, MD  Procedure: Injection  Xolair (Omalizumab), Dose: 375 mg, Site: subcutaneous, x2 right arm, x1 left arm  Discharge: Condition: Good, Destination: Home . AVS provided to patient.   Performed by:  Nat Math, RN

## 2020-06-09 ENCOUNTER — Ambulatory Visit: Payer: 59

## 2020-06-15 ENCOUNTER — Other Ambulatory Visit: Payer: Self-pay

## 2020-06-15 ENCOUNTER — Ambulatory Visit (INDEPENDENT_AMBULATORY_CARE_PROVIDER_SITE_OTHER): Payer: 59 | Admitting: *Deleted

## 2020-06-15 VITALS — BP 125/72 | HR 128 | Temp 97.8°F | Resp 18

## 2020-06-15 DIAGNOSIS — J449 Chronic obstructive pulmonary disease, unspecified: Secondary | ICD-10-CM

## 2020-06-15 MED ORDER — FAMOTIDINE IN NACL 20-0.9 MG/50ML-% IV SOLN: 20.0000 mg | Freq: Once | INTRAVENOUS | Status: DC | PRN

## 2020-06-15 MED ORDER — EPINEPHRINE 0.3 MG/0.3ML IJ SOAJ: 0.3000 mg | Freq: Once | INTRAMUSCULAR | Status: DC | PRN

## 2020-06-15 MED ORDER — METHYLPREDNISOLONE SODIUM SUCC 125 MG IJ SOLR: 125.0000 mg | Freq: Once | INTRAMUSCULAR | Status: DC | PRN

## 2020-06-15 MED ORDER — ALBUTEROL SULFATE HFA 108 (90 BASE) MCG/ACT IN AERS: 2.0000 | INHALATION_SPRAY | Freq: Once | RESPIRATORY_TRACT | Status: DC | PRN

## 2020-06-15 MED ORDER — OMALIZUMAB 75 MG/0.5ML ~~LOC~~ SOSY
375.0000 mg | PREFILLED_SYRINGE | Freq: Once | SUBCUTANEOUS | Status: AC
  Administered 2020-06-15: 375 mg via SUBCUTANEOUS
  Filled 2020-06-15: qty 0.5

## 2020-06-15 MED ORDER — DIPHENHYDRAMINE HCL 50 MG/ML IJ SOLN: 50.0000 mg | Freq: Once | INTRAMUSCULAR | Status: DC | PRN

## 2020-06-15 MED ORDER — SODIUM CHLORIDE 0.9 % IV SOLN: Freq: Once | INTRAVENOUS | Status: DC | PRN

## 2020-06-15 NOTE — Progress Notes (Signed)
Diagnosis: Asthma  Provider:  Praveen Mannam, MD  Procedure: Injection  Xolair (Omalizumab), Dose: 375 mg, Site: subcutaneous  Discharge: Condition: Good, Destination: Home . AVS provided to patient.   Performed by:  Elese Rane A, RN        

## 2020-06-22 ENCOUNTER — Ambulatory Visit: Payer: 59

## 2020-06-23 ENCOUNTER — Ambulatory Visit: Payer: 59

## 2020-06-29 ENCOUNTER — Ambulatory Visit: Payer: 59

## 2020-06-30 ENCOUNTER — Other Ambulatory Visit: Payer: Self-pay

## 2020-06-30 ENCOUNTER — Ambulatory Visit (INDEPENDENT_AMBULATORY_CARE_PROVIDER_SITE_OTHER): Payer: 59

## 2020-06-30 VITALS — BP 99/63 | HR 71 | Temp 98.1°F | Resp 20

## 2020-06-30 DIAGNOSIS — J449 Chronic obstructive pulmonary disease, unspecified: Secondary | ICD-10-CM | POA: Diagnosis not present

## 2020-06-30 MED ORDER — EPINEPHRINE 0.3 MG/0.3ML IJ SOAJ: 0.3000 mg | Freq: Once | INTRAMUSCULAR | Status: DC | PRN

## 2020-06-30 MED ORDER — SODIUM CHLORIDE 0.9 % IV SOLN: Freq: Once | INTRAVENOUS | Status: DC | PRN

## 2020-06-30 MED ORDER — FAMOTIDINE IN NACL 20-0.9 MG/50ML-% IV SOLN: 20.0000 mg | Freq: Once | INTRAVENOUS | Status: DC | PRN

## 2020-06-30 MED ORDER — DIPHENHYDRAMINE HCL 50 MG/ML IJ SOLN: 50.0000 mg | Freq: Once | INTRAMUSCULAR | Status: DC | PRN

## 2020-06-30 MED ORDER — OMALIZUMAB 75 MG/0.5ML ~~LOC~~ SOSY
375.0000 mg | PREFILLED_SYRINGE | Freq: Once | SUBCUTANEOUS | Status: AC
  Administered 2020-06-30: 375 mg via SUBCUTANEOUS
  Filled 2020-06-30: qty 0.5

## 2020-06-30 MED ORDER — METHYLPREDNISOLONE SODIUM SUCC 125 MG IJ SOLR
125.0000 mg | Freq: Once | INTRAMUSCULAR | Status: DC | PRN
Start: 2020-06-30 — End: 2020-06-30

## 2020-06-30 MED ORDER — ALBUTEROL SULFATE HFA 108 (90 BASE) MCG/ACT IN AERS: 2.0000 | INHALATION_SPRAY | Freq: Once | RESPIRATORY_TRACT | Status: DC | PRN

## 2020-06-30 NOTE — Progress Notes (Signed)
Diagnosis: Asthma  Provider:  Praveen Mannam, MD  Procedure: Injection  Xolair (Omalizumab), Dose: 375 mg, Site: subcutaneous  Discharge: Condition: Good, Destination: Home . AVS provided to patient.   Performed by:  Itzayanna Kaster A, RN        

## 2020-07-06 ENCOUNTER — Ambulatory Visit: Payer: 59

## 2020-07-07 ENCOUNTER — Ambulatory Visit: Payer: 59

## 2020-07-13 ENCOUNTER — Ambulatory Visit: Payer: 59

## 2020-07-16 ENCOUNTER — Other Ambulatory Visit: Payer: Self-pay

## 2020-07-16 ENCOUNTER — Ambulatory Visit: Payer: 59

## 2020-07-16 ENCOUNTER — Ambulatory Visit (INDEPENDENT_AMBULATORY_CARE_PROVIDER_SITE_OTHER): Payer: 59 | Admitting: *Deleted

## 2020-07-16 VITALS — BP 96/63 | HR 67 | Temp 97.9°F | Resp 18

## 2020-07-16 DIAGNOSIS — J449 Chronic obstructive pulmonary disease, unspecified: Secondary | ICD-10-CM

## 2020-07-16 MED ORDER — ALBUTEROL SULFATE HFA 108 (90 BASE) MCG/ACT IN AERS: 2.0000 | INHALATION_SPRAY | Freq: Once | RESPIRATORY_TRACT | Status: DC | PRN

## 2020-07-16 MED ORDER — EPINEPHRINE 0.3 MG/0.3ML IJ SOAJ: 0.3000 mg | Freq: Once | INTRAMUSCULAR | Status: DC | PRN

## 2020-07-16 MED ORDER — OMALIZUMAB 150 MG/ML ~~LOC~~ SOSY
375.0000 mg | PREFILLED_SYRINGE | Freq: Once | SUBCUTANEOUS | Status: AC
  Administered 2020-07-16: 375 mg via SUBCUTANEOUS

## 2020-07-16 MED ORDER — SODIUM CHLORIDE 0.9 % IV SOLN: Freq: Once | INTRAVENOUS | Status: DC | PRN

## 2020-07-16 MED ORDER — FAMOTIDINE IN NACL 20-0.9 MG/50ML-% IV SOLN: 20.0000 mg | Freq: Once | INTRAVENOUS | Status: DC | PRN

## 2020-07-16 MED ORDER — DIPHENHYDRAMINE HCL 50 MG/ML IJ SOLN: 50.0000 mg | Freq: Once | INTRAMUSCULAR | Status: DC | PRN

## 2020-07-16 MED ORDER — METHYLPREDNISOLONE SODIUM SUCC 125 MG IJ SOLR: 125.0000 mg | Freq: Once | INTRAMUSCULAR | Status: DC | PRN

## 2020-07-16 NOTE — Patient Instructions (Signed)
\  [id number]\

## 2020-07-16 NOTE — Progress Notes (Signed)
Diagnosis: Asthma  Provider:  Praveen Mannam, MD  Procedure: Injection  Xolair (Omalizumab), Dose: 375 mg, Site: subcutaneous  Discharge: Condition: Good, Destination: Home . AVS provided to patient.   Performed by:  Branton Einstein A, RN        

## 2020-07-20 ENCOUNTER — Ambulatory Visit: Payer: 59

## 2020-07-21 ENCOUNTER — Ambulatory Visit: Payer: 59

## 2020-07-27 ENCOUNTER — Ambulatory Visit: Payer: 59

## 2020-08-02 ENCOUNTER — Ambulatory Visit: Payer: 59

## 2020-08-02 ENCOUNTER — Telehealth: Payer: Self-pay

## 2020-08-03 ENCOUNTER — Ambulatory Visit: Payer: 59

## 2020-08-04 ENCOUNTER — Ambulatory Visit: Payer: 59

## 2020-08-10 ENCOUNTER — Ambulatory Visit: Payer: 59

## 2020-08-13 ENCOUNTER — Ambulatory Visit (INDEPENDENT_AMBULATORY_CARE_PROVIDER_SITE_OTHER): Payer: 59

## 2020-08-13 ENCOUNTER — Other Ambulatory Visit: Payer: Self-pay

## 2020-08-13 VITALS — BP 104/70 | HR 86 | Temp 98.1°F | Resp 20

## 2020-08-13 DIAGNOSIS — J449 Chronic obstructive pulmonary disease, unspecified: Secondary | ICD-10-CM

## 2020-08-13 MED ORDER — OMALIZUMAB 75 MG/0.5ML ~~LOC~~ SOSY
375.0000 mg | PREFILLED_SYRINGE | Freq: Once | SUBCUTANEOUS | Status: AC
  Administered 2020-08-13: 375 mg via SUBCUTANEOUS
  Filled 2020-08-13: qty 0.5

## 2020-08-13 NOTE — Progress Notes (Signed)
Diagnosis: Asthma  Provider:  Chilton Greathouse, MD  Procedure: Injection  Xolair (Omalizumab), Dose: 375 mg, Site: subcutaneous 2 left arm, right arm   Discharge: Condition: Good, Destination: Home . AVS provided to patient.   Performed by:  Jakevion Arney, Lyman Speller, LPN

## 2020-08-17 ENCOUNTER — Ambulatory Visit: Payer: 59

## 2020-08-18 ENCOUNTER — Ambulatory Visit: Payer: 59

## 2020-08-23 ENCOUNTER — Telehealth: Payer: Self-pay | Admitting: Internal Medicine

## 2020-08-23 DIAGNOSIS — J4550 Severe persistent asthma, uncomplicated: Secondary | ICD-10-CM

## 2020-08-23 MED ORDER — OMALIZUMAB 150 MG/ML ~~LOC~~ SOSY: 300.0000 mg | PREFILLED_SYRINGE | SUBCUTANEOUS | 12 refills | Status: DC

## 2020-08-23 NOTE — Telephone Encounter (Signed)
Patient has appt on 04/21/20 with Dr. Maple Hudson with plan to continue [place].  Dose: 300mg  every 14 days  Refill sent to [facility].  , PharmD, MPH, BCPS Clinical Pharmacist (Rheumatology and Pulmonology)

## 2020-08-24 ENCOUNTER — Ambulatory Visit: Payer: 59

## 2020-08-27 ENCOUNTER — Ambulatory Visit (INDEPENDENT_AMBULATORY_CARE_PROVIDER_SITE_OTHER): Payer: 59

## 2020-08-27 ENCOUNTER — Other Ambulatory Visit: Payer: Self-pay

## 2020-08-27 VITALS — BP 93/58 | HR 71 | Temp 98.3°F | Resp 16

## 2020-08-27 DIAGNOSIS — J449 Chronic obstructive pulmonary disease, unspecified: Secondary | ICD-10-CM

## 2020-08-27 MED ORDER — OMALIZUMAB 75 MG/0.5ML ~~LOC~~ SOSY
375.0000 mg | PREFILLED_SYRINGE | Freq: Once | SUBCUTANEOUS | Status: AC
  Administered 2020-08-27: 375 mg via SUBCUTANEOUS
  Filled 2020-08-27: qty 0.5

## 2020-08-27 NOTE — Progress Notes (Signed)
Diagnosis: Asthma  Provider:  Chilton Greathouse, MD  Procedure: Injection  Xolair (Omalizumab), Dose: 375 mg, Site: subcutaneous 2 x right arm and 1 x left   Discharge: Condition: Good, Destination: Home . AVS provided to patient.   Performed by:  Nayah Lukens, Lyman Speller, LPN

## 2020-08-31 ENCOUNTER — Ambulatory Visit: Payer: 59

## 2020-08-31 ENCOUNTER — Other Ambulatory Visit: Payer: Self-pay | Admitting: Obstetrics and Gynecology

## 2020-08-31 DIAGNOSIS — Z1231 Encounter for screening mammogram for malignant neoplasm of breast: Secondary | ICD-10-CM

## 2020-09-01 ENCOUNTER — Ambulatory Visit: Payer: 59

## 2020-09-07 ENCOUNTER — Ambulatory Visit: Payer: 59

## 2020-09-09 ENCOUNTER — Other Ambulatory Visit: Payer: Self-pay

## 2020-09-09 ENCOUNTER — Ambulatory Visit (INDEPENDENT_AMBULATORY_CARE_PROVIDER_SITE_OTHER): Payer: 59

## 2020-09-09 VITALS — BP 119/66 | HR 82 | Temp 98.4°F | Resp 16

## 2020-09-09 DIAGNOSIS — J449 Chronic obstructive pulmonary disease, unspecified: Secondary | ICD-10-CM | POA: Diagnosis not present

## 2020-09-09 MED ORDER — OMALIZUMAB 75 MG/0.5ML ~~LOC~~ SOSY
375.0000 mg | PREFILLED_SYRINGE | Freq: Once | SUBCUTANEOUS | Status: AC
  Administered 2020-09-09: 375 mg via SUBCUTANEOUS
  Filled 2020-09-09: qty 0.5

## 2020-09-09 NOTE — Progress Notes (Signed)
Diagnosis: Asthma  Provider:  Chilton Greathouse, MD  Procedure: Injection  Xolair (Omalizumab), Dose: 375 mg, Site: subcutaneous left x2, right x1  Discharge: Condition: Good, Destination: Home . AVS provided to patient.   Performed by:  Nat Math, RN

## 2020-09-10 ENCOUNTER — Ambulatory Visit: Payer: 59

## 2020-09-14 ENCOUNTER — Ambulatory Visit: Payer: 59

## 2020-09-15 ENCOUNTER — Ambulatory Visit: Payer: 59

## 2020-09-21 ENCOUNTER — Ambulatory Visit: Payer: 59

## 2020-09-23 ENCOUNTER — Ambulatory Visit: Payer: 59

## 2020-09-24 ENCOUNTER — Other Ambulatory Visit: Payer: Self-pay

## 2020-09-24 ENCOUNTER — Ambulatory Visit (INDEPENDENT_AMBULATORY_CARE_PROVIDER_SITE_OTHER): Payer: 59

## 2020-09-24 VITALS — BP 117/70 | HR 80 | Temp 98.4°F | Resp 16

## 2020-09-24 DIAGNOSIS — J449 Chronic obstructive pulmonary disease, unspecified: Secondary | ICD-10-CM | POA: Diagnosis not present

## 2020-09-24 MED ORDER — OMALIZUMAB 75 MG/0.5ML ~~LOC~~ SOSY
375.0000 mg | PREFILLED_SYRINGE | Freq: Once | SUBCUTANEOUS | Status: AC
  Administered 2020-09-24: 375 mg via SUBCUTANEOUS
  Filled 2020-09-24: qty 0.5

## 2020-09-24 NOTE — Progress Notes (Signed)
Diagnosis: Asthma  Provider:  Praveen Mannam, MD  Procedure: Injection  Xolair (Omalizumab), Dose: 375 mg, Site: subcutaneous  Discharge: Condition: Good, Destination: Home . AVS provided to patient.   Performed by:  Maddyson Keil N Osha Rane, RN        

## 2020-09-28 ENCOUNTER — Ambulatory Visit: Payer: 59

## 2020-09-29 ENCOUNTER — Ambulatory Visit: Payer: 59

## 2020-10-05 ENCOUNTER — Ambulatory Visit: Payer: 59

## 2020-10-07 ENCOUNTER — Other Ambulatory Visit: Payer: Self-pay

## 2020-10-07 ENCOUNTER — Ambulatory Visit (INDEPENDENT_AMBULATORY_CARE_PROVIDER_SITE_OTHER): Payer: 59 | Admitting: *Deleted

## 2020-10-07 VITALS — BP 99/62 | HR 74 | Temp 98.0°F | Resp 16 | Wt 97.2 lb

## 2020-10-07 DIAGNOSIS — J449 Chronic obstructive pulmonary disease, unspecified: Secondary | ICD-10-CM

## 2020-10-07 MED ORDER — FAMOTIDINE IN NACL 20-0.9 MG/50ML-% IV SOLN: 20.0000 mg | Freq: Once | INTRAVENOUS | Status: DC | PRN

## 2020-10-07 MED ORDER — EPINEPHRINE 0.3 MG/0.3ML IJ SOAJ: 0.3000 mg | Freq: Once | INTRAMUSCULAR | Status: DC | PRN

## 2020-10-07 MED ORDER — DIPHENHYDRAMINE HCL 50 MG/ML IJ SOLN: 50.0000 mg | Freq: Once | INTRAMUSCULAR | Status: DC | PRN

## 2020-10-07 MED ORDER — ALBUTEROL SULFATE HFA 108 (90 BASE) MCG/ACT IN AERS: 2.0000 | INHALATION_SPRAY | Freq: Once | RESPIRATORY_TRACT | Status: DC | PRN

## 2020-10-07 MED ORDER — SODIUM CHLORIDE 0.9 % IV SOLN: Freq: Once | INTRAVENOUS | Status: DC | PRN

## 2020-10-07 MED ORDER — OMALIZUMAB 75 MG/0.5ML ~~LOC~~ SOSY
375.0000 mg | PREFILLED_SYRINGE | Freq: Once | SUBCUTANEOUS | Status: AC
  Administered 2020-10-07: 375 mg via SUBCUTANEOUS
  Filled 2020-10-07: qty 0.5

## 2020-10-07 MED ORDER — METHYLPREDNISOLONE SODIUM SUCC 125 MG IJ SOLR: 125.0000 mg | Freq: Once | INTRAMUSCULAR | Status: DC | PRN

## 2020-10-07 NOTE — Progress Notes (Signed)
Diagnosis: Asthma  Provider:  Praveen Mannam, MD  Procedure: Injection  Xolair (Omalizumab), Dose: 375 mg, Site: subcutaneous  Discharge: Condition: Good, Destination: Home . AVS provided to patient.   Performed by:  Ascher Schroepfer A, RN        

## 2020-10-12 ENCOUNTER — Ambulatory Visit: Payer: 59

## 2020-10-13 ENCOUNTER — Ambulatory Visit: Payer: 59

## 2020-10-18 ENCOUNTER — Other Ambulatory Visit: Payer: Self-pay

## 2020-10-18 ENCOUNTER — Ambulatory Visit
Admission: RE | Admit: 2020-10-18 | Discharge: 2020-10-18 | Disposition: A | Discharge status: Home / Self Care | Payer: 59 | Source: Ambulatory Visit | Attending: Obstetrics and Gynecology | Admitting: Obstetrics and Gynecology

## 2020-10-18 DIAGNOSIS — Z1231 Encounter for screening mammogram for malignant neoplasm of breast: Secondary | ICD-10-CM

## 2020-10-21 ENCOUNTER — Other Ambulatory Visit: Payer: Self-pay

## 2020-10-21 ENCOUNTER — Ambulatory Visit: Payer: 59 | Admitting: Internal Medicine

## 2020-10-21 ENCOUNTER — Ambulatory Visit (INDEPENDENT_AMBULATORY_CARE_PROVIDER_SITE_OTHER): Payer: 59 | Admitting: *Deleted

## 2020-10-21 VITALS — BP 117/64 | HR 67 | Temp 97.9°F | Resp 16 | Ht 62.0 in | Wt 98.6 lb

## 2020-10-21 DIAGNOSIS — J449 Chronic obstructive pulmonary disease, unspecified: Secondary | ICD-10-CM

## 2020-10-21 MED ORDER — OMALIZUMAB 75 MG/0.5ML ~~LOC~~ SOSY
375.0000 mg | PREFILLED_SYRINGE | Freq: Once | SUBCUTANEOUS | Status: AC
  Administered 2020-10-21: 375 mg via SUBCUTANEOUS
  Filled 2020-10-21: qty 0.5

## 2020-10-21 NOTE — Progress Notes (Signed)
Diagnosis: Asthma  Provider:  Praveen Mannam, MD  Procedure: Injection  Xolair (Omalizumab), Dose: 375 mg, Site: subcutaneous  Discharge: Condition: Good, Destination: Home . AVS provided to patient.   Performed by:  Imaan Padgett A, RN        

## 2020-10-26 ENCOUNTER — Ambulatory Visit: Payer: 59 | Admitting: Internal Medicine

## 2020-10-27 ENCOUNTER — Ambulatory Visit: Payer: 59

## 2020-10-27 NOTE — Progress Notes (Signed)
Subjective:    Patient ID: Sara Trevino, female    DOB: 09-07-1965, 55 y.o.   MRN: [id number]  HPI  55 year old female never smoker followed for COPD mixed type/chronic cough, allergic rhinitis, nasal polyps Allergy Vaccine-Dr. [place] Callas x 3 years w/o clear benefit CT chest 11/2013 clear lungs  CT sinus 12/2013 neg for acute process  Allergy profile 07/21/14- Total IgE 249  Elevated for dust mite. Grass pollens, tree pollens CBC w diff 07/21/14- EOS  13.6 H Allergy skin test 04/21/11 elevated dog, weed pollens, tree pollens molds  She complains primarily of coughing 5 years. Occasional white or yellow sputum. 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 Does not recognize association between symptoms and workplace Had seen Dr. Lyda Jester for second opinion and he also thought Nucala might be appropriate She was not interested in [place] because it is "too new". PFT 02/23/14 moderate obstructive airways disease, significant response to bronchodilator, air trapping, normal diffusion, normal TLC. FEV1/FVC 0.68,  She was seen at [facility] by ENT/ Dr Senior who identified nasal polyps and has scheduled a CT of sinuses and recommended bilateral endoscopic sinus surgery Quantiferon Gold TB assay Positive 2018  PFT 11/02/16-moderately severe obstructive airways disease with insignificant response to bronchodilator. Diffusion slightly reduced ----------------------------------------------------------------------------------------------r.   04/21/20- 55 year old female never smoker( + second-hand) followed for / AsthmaCOPD mixed type/ Bronchiectasis, chronic cough, allergic rhinitis, nasal polyps, + Quantiferon Gold/ culture Neg Elevated IgE and Eosinophils, Started Xolair 12/05/19  - Proair hfa, Symbicort 160, Xolair, Covid vax-3 Moderna Flu vax-had Very much better since starting Xolair injections. No problem with reactions.  Significant reduction in chronic cough, little wheeze. She continued Symbicort, but not needing rescue inhaler. She is very pleased with Xolair.  HRCT chest 12/15/19- IMPRESSION: 1. Mild cylindrical bronchiectasis with scattered areas of mild mucoid impaction. 2. Aortic atherosclerosis. Aortic Atherosclerosis (ICD10-I70.0).  10/28/20- 55 year old female never smoker( + second-hand) followed for / AsthmaCOPD mixed type/ Bronchiectasis, chronic cough, allergic rhinitis, nasal polyps, + Quantiferon Gold/ culture Neg Elevated IgE and Eosinophils, Started Xolair 12/05/19  - Proair hfa, Symbicort 160, Xolair, Covid vax-3 Moderna Flu vax- Credits Xolair for having much less cough in the past year.  Continues Symbicort.  Asks about increasing Xolair interval to monthly.  She can discuss this with the infusion clinic.  ROS-see HPI  + = positive Constitutional:    weight loss, night sweats, fevers, chills, fatigue, lassitude. HEENT:    headaches, difficulty swallowing, tooth/dental problems, +sore throat,       +sneezing, +itching, ear ache, +nasal congestion, post nasal drip, snoring CV:    chest pain, orthopnea, PND, swelling in lower extremities, anasarca,                                                     dizziness, palpitations Resp:   +shortness of breath with exertion or at rest.                productive cough,   +non-productive cough, coughing up of blood.              change in color of mucus.   wheezing.   Skin:    rash or lesions. GI:  No-   heartburn, indigestion, abdominal pain, nausea, vomiting, diarrhea,  change in bowel habits, loss of appetite GU: dysuria, change in color of urine, no urgency or frequency.   flank pain. MS:   joint pain, +stiffness, decreased range of motion, back pain. Neuro-     nothing unusual Psych:  change in mood or affect.  depression or anxiety.   memory loss.    Objective:   Physical Exam OBJ- Physical Exam General- Alert, Oriented,  Affect-appropriate, Distress- none acute, + thin Skin- rash-none, lesions- none, excoriation- none Lymphadenopathy- none Head- atraumatic            Eyes- Gross vision intact, PERRLA, conjunctivae and secretions clear            Ears- Hearing, canals-normal            Nose- + Mild turbinate edema, no-Septal dev, mucus, polyps, erosion, perforation             Throat- Mallampati II , mucosa clear , drainage- none, tonsils- atrophic Neck- flexible , trachea midline, no stridor , thyroid nl, carotid no bruit Chest - symmetrical excursion , unlabored           Heart/CV- RRR , no murmur , no gallop  , no rub, nl s1 s2                           - JVD- none , edema- none, stasis changes- none, varices- none           Lung-  Cough+light,  rhonchi- none, wheeze-none , dullness-none, rub- none           Chest wall-  Abd-  Br/ Gen/ Rectal- Not done, not indicated Extrem- cyanosis- none, clubbing, none, atrophy- none, strength- nl Neuro- grossly intact to observation     Assessment & Plan:

## 2020-10-28 ENCOUNTER — Ambulatory Visit: Payer: 59 | Admitting: Internal Medicine

## 2020-10-28 ENCOUNTER — Other Ambulatory Visit: Payer: Self-pay

## 2020-10-28 ENCOUNTER — Encounter: Payer: Self-pay | Admitting: Internal Medicine

## 2020-10-28 ENCOUNTER — Telehealth: Payer: Self-pay

## 2020-10-28 VITALS — BP 84/56 | HR 68 | Temp 98.2°F | Ht 62.0 in | Wt 99.2 lb

## 2020-10-28 DIAGNOSIS — J4550 Severe persistent asthma, uncomplicated: Secondary | ICD-10-CM

## 2020-10-28 DIAGNOSIS — Z23 Encounter for immunization: Secondary | ICD-10-CM | POA: Diagnosis not present

## 2020-10-28 DIAGNOSIS — J339 Nasal polyp, unspecified: Secondary | ICD-10-CM

## 2020-10-28 DIAGNOSIS — J479 Bronchiectasis, uncomplicated: Secondary | ICD-10-CM | POA: Diagnosis not present

## 2020-10-28 DIAGNOSIS — J449 Chronic obstructive pulmonary disease, unspecified: Secondary | ICD-10-CM

## 2020-10-28 MED ORDER — OMALIZUMAB 150 MG/ML ~~LOC~~ SOSY: 300.0000 mg | PREFILLED_SYRINGE | SUBCUTANEOUS | 12 refills | Status: DC

## 2020-10-28 MED ORDER — OMALIZUMAB 75 MG/0.5ML ~~LOC~~ SOSY: 75.0000 mg | PREFILLED_SYRINGE | SUBCUTANEOUS | 12 refills | Status: DC

## 2020-10-28 NOTE — Patient Instructions (Addendum)
Order- Infusion Center- please try increasing interval between Xolair shots to monthly.  Order flu vax- standard  Order- schedule CT chest no contrast   dx Bronchiectasis  Please call if we can help

## 2020-10-28 NOTE — Telephone Encounter (Signed)
Infusion center/Pharmacy team please try increasing interval between Xolair shots to monthly.

## 2020-10-28 NOTE — Telephone Encounter (Signed)
Infusion center/Pharmacy team please try increasing interval between Xolair shots monthly

## 2020-10-28 NOTE — Telephone Encounter (Signed)
Therapy plan for [facility] [address] [facility] changed to every 4 weeks.  Her last Xolair dose was on 10/21/20, therefore next dose will be on 11/18/20  Dose: 375 mg every 28 days  ATC patient to review but unable to reach. Left VM to advise that I will be notifying infusion center since appointments will need to be cancelled  Rx for 75mg  syringe was discontinued in May 2022 - unclear at to why as patient was receiving this dose. Reordered prescriptions today and sent to [facility] with note of interval adjustment  June 2022, PharmD, MPH, BCPS Clinical Pharmacist (Rheumatology and Pulmonology)

## 2020-10-29 NOTE — Telephone Encounter (Signed)
@  [facility],  please re-schedule her Xolair appt for q28 days. Thanks Selena Batten

## 2020-11-03 ENCOUNTER — Telehealth (HOSPITAL_BASED_OUTPATIENT_CLINIC_OR_DEPARTMENT_OTHER): Payer: Self-pay

## 2020-11-04 ENCOUNTER — Telehealth (HOSPITAL_BASED_OUTPATIENT_CLINIC_OR_DEPARTMENT_OTHER): Payer: Self-pay

## 2020-11-04 ENCOUNTER — Ambulatory Visit: Payer: 59

## 2020-11-08 ENCOUNTER — Ambulatory Visit: Payer: 59

## 2020-11-08 ENCOUNTER — Other Ambulatory Visit: Payer: Self-pay

## 2020-11-08 ENCOUNTER — Ambulatory Visit (HOSPITAL_BASED_OUTPATIENT_CLINIC_OR_DEPARTMENT_OTHER)
Admission: RE | Admit: 2020-11-08 | Discharge: 2020-11-08 | Disposition: A | Discharge status: Home / Self Care | Payer: 59 | Source: Ambulatory Visit | Attending: Internal Medicine | Admitting: Internal Medicine

## 2020-11-08 DIAGNOSIS — J479 Bronchiectasis, uncomplicated: Secondary | ICD-10-CM | POA: Diagnosis not present

## 2020-11-10 ENCOUNTER — Ambulatory Visit: Payer: 59

## 2020-11-18 ENCOUNTER — Ambulatory Visit: Payer: 59

## 2020-11-19 ENCOUNTER — Other Ambulatory Visit: Payer: Self-pay

## 2020-11-19 ENCOUNTER — Ambulatory Visit (INDEPENDENT_AMBULATORY_CARE_PROVIDER_SITE_OTHER): Payer: 59

## 2020-11-19 VITALS — BP 125/76 | HR 77 | Temp 97.7°F | Resp 18 | Wt 99.4 lb

## 2020-11-19 DIAGNOSIS — J449 Chronic obstructive pulmonary disease, unspecified: Secondary | ICD-10-CM | POA: Diagnosis not present

## 2020-11-19 MED ORDER — EPINEPHRINE 0.3 MG/0.3ML IJ SOAJ: 0.3000 mg | Freq: Once | INTRAMUSCULAR | Status: DC | PRN

## 2020-11-19 MED ORDER — OMALIZUMAB 75 MG/0.5ML ~~LOC~~ SOSY
375.0000 mg | PREFILLED_SYRINGE | Freq: Once | SUBCUTANEOUS | Status: AC
  Administered 2020-11-19: 375 mg via SUBCUTANEOUS
  Filled 2020-11-19: qty 0.5

## 2020-11-19 MED ORDER — DIPHENHYDRAMINE HCL 50 MG/ML IJ SOLN: 50.0000 mg | Freq: Once | INTRAMUSCULAR | Status: DC | PRN

## 2020-11-19 MED ORDER — METHYLPREDNISOLONE SODIUM SUCC 125 MG IJ SOLR: 125.0000 mg | Freq: Once | INTRAMUSCULAR | Status: DC | PRN

## 2020-11-19 MED ORDER — FAMOTIDINE IN NACL 20-0.9 MG/50ML-% IV SOLN: 20.0000 mg | Freq: Once | INTRAVENOUS | Status: DC | PRN

## 2020-11-19 MED ORDER — SODIUM CHLORIDE 0.9 % IV SOLN: Freq: Once | INTRAVENOUS | Status: DC | PRN

## 2020-11-19 MED ORDER — ALBUTEROL SULFATE HFA 108 (90 BASE) MCG/ACT IN AERS: 2.0000 | INHALATION_SPRAY | Freq: Once | RESPIRATORY_TRACT | Status: DC | PRN

## 2020-11-19 NOTE — Progress Notes (Signed)
Diagnosis: Asthma  Provider:  Praveen Mannam, MD  Procedure: Injection  Xolair (Omalizumab), Dose: 375 mg, Site: subcutaneous  Discharge: Condition: Good, Destination: Home . AVS provided to patient.   Performed by:  Brizeida Mcmurry, RN        

## 2020-11-24 ENCOUNTER — Ambulatory Visit: Payer: 59

## 2020-12-02 ENCOUNTER — Ambulatory Visit: Payer: 59

## 2020-12-06 ENCOUNTER — Telehealth: Payer: Self-pay

## 2020-12-06 NOTE — Telephone Encounter (Signed)
Received notification from [place] that patient has contacted regarding her Xolair. They are requesting provider portion be filled out and sent in to initiate services. Will place provider portion in Dr. Roxy Cedar box.

## 2020-12-07 NOTE — Telephone Encounter (Signed)
Submitted Patient Assistance Application to [place] for [place] along with provider portion, PA and income documents. Will update patient when we receive a response.  Fax# [phone] Phone# [phone]

## 2020-12-08 ENCOUNTER — Ambulatory Visit: Payer: 59

## 2020-12-16 ENCOUNTER — Other Ambulatory Visit: Payer: Self-pay

## 2020-12-16 ENCOUNTER — Ambulatory Visit (INDEPENDENT_AMBULATORY_CARE_PROVIDER_SITE_OTHER): Payer: 59

## 2020-12-16 VITALS — BP 109/70 | HR 83 | Temp 97.6°F | Resp 16 | Wt 101.0 lb

## 2020-12-16 DIAGNOSIS — J449 Chronic obstructive pulmonary disease, unspecified: Secondary | ICD-10-CM | POA: Diagnosis not present

## 2020-12-16 MED ORDER — SODIUM CHLORIDE 0.9 % IV SOLN: Freq: Once | INTRAVENOUS | Status: DC | PRN

## 2020-12-16 MED ORDER — DIPHENHYDRAMINE HCL 50 MG/ML IJ SOLN: 50.0000 mg | Freq: Once | INTRAMUSCULAR | Status: DC | PRN

## 2020-12-16 MED ORDER — OMALIZUMAB 75 MG/0.5ML ~~LOC~~ SOSY
375.0000 mg | PREFILLED_SYRINGE | Freq: Once | SUBCUTANEOUS | Status: AC
  Administered 2020-12-16: 375 mg via SUBCUTANEOUS
  Filled 2020-12-16: qty 0.5

## 2020-12-16 MED ORDER — EPINEPHRINE 0.3 MG/0.3ML IJ SOAJ: 0.3000 mg | Freq: Once | INTRAMUSCULAR | Status: DC | PRN

## 2020-12-16 MED ORDER — METHYLPREDNISOLONE SODIUM SUCC 125 MG IJ SOLR: 125.0000 mg | Freq: Once | INTRAMUSCULAR | Status: DC | PRN

## 2020-12-16 MED ORDER — ALBUTEROL SULFATE HFA 108 (90 BASE) MCG/ACT IN AERS: 2.0000 | INHALATION_SPRAY | Freq: Once | RESPIRATORY_TRACT | Status: DC | PRN

## 2020-12-16 MED ORDER — FAMOTIDINE IN NACL 20-0.9 MG/50ML-% IV SOLN: 20.0000 mg | Freq: Once | INTRAVENOUS | Status: DC | PRN

## 2020-12-16 NOTE — Progress Notes (Signed)
Diagnosis: Asthma  Provider:  Chilton Greathouse, MD  Procedure: Injection  Xolair (Omalizumab), Dose: 375 mg, Site: subcutaneous  Discharge: Condition: Good, Destination: Home . AVS provided to patient.   Performed by:  Marilynn Rail, RN

## 2020-12-21 NOTE — Telephone Encounter (Signed)
Received fax from [place] stating that patient's signature was needed for reauthorization. Contacted pt and determined that pt would like form sent via [employer]. Preferred email verified. Will f/u once form is returned.

## 2020-12-22 ENCOUNTER — Ambulatory Visit: Payer: 59

## 2020-12-22 NOTE — Telephone Encounter (Signed)
Patient portion received and faxed in to [place]. Will await determination.

## 2020-12-30 ENCOUNTER — Encounter: Payer: Self-pay | Admitting: Physical Therapy

## 2020-12-30 ENCOUNTER — Ambulatory Visit: Payer: 59 | Attending: Nephrology | Admitting: Physical Therapy

## 2020-12-30 ENCOUNTER — Other Ambulatory Visit: Payer: Self-pay

## 2020-12-30 DIAGNOSIS — M25612 Stiffness of left shoulder, not elsewhere classified: Secondary | ICD-10-CM | POA: Insufficient documentation

## 2020-12-30 DIAGNOSIS — M25512 Pain in left shoulder: Secondary | ICD-10-CM | POA: Insufficient documentation

## 2020-12-30 DIAGNOSIS — M62838 Other muscle spasm: Secondary | ICD-10-CM | POA: Insufficient documentation

## 2020-12-30 DIAGNOSIS — G8929 Other chronic pain: Secondary | ICD-10-CM | POA: Diagnosis present

## 2020-12-30 DIAGNOSIS — M6281 Muscle weakness (generalized): Secondary | ICD-10-CM | POA: Insufficient documentation

## 2020-12-30 NOTE — Patient Instructions (Signed)
Access Code: [email or web address] URL: [email or web address] Date: 12/30/2020 Prepared by: Harrie Foreman  Exercises Sidelying Open Book Thoracic Lumbar Rotation and Extension - 1 x daily - 7 x weekly - 2 sets - 10 reps Seated Scapular Retraction - 1 x daily - 7 x weekly - 2 sets - 10 reps - 5 sec hold Seated Cervical Retraction - 1 x daily - 7 x weekly - 1 sets - 10 reps - 5 sec hold Seated Shoulder Rolls - 3 x daily - 7 x weekly - 1 sets - 10 reps Corner Pec [place] Stretch - 1 x daily - 7 x weekly - 1 sets - 3 reps - 15-30 sec hold

## 2020-12-30 NOTE — Therapy (Signed)
[facility] [address]  [address] [place], [place], [address] Phone: [phone]   Fax:  [phone]  Physical Therapy Evaluation  Patient Details  Name: Tricha Ruggirello MRN: [id number] Date of Birth: [birth year] Referring Provider (PT): Madaline Brilliant MD   Encounter Date: 12/30/2020   PT End of Session - 12/30/20 0937     Visit Number 1    Number of Visits 12    Date for PT Re-Evaluation 02/10/21    Authorization Type UHC VL 60    PT Start Time 0850    PT Stop Time 0935    PT Time Calculation (min) 45 min    Activity Tolerance Patient tolerated treatment well    Behavior During Therapy [facility] for tasks assessed/performed             Past Medical History:  Diagnosis Date   Allergy    Asthma    Cough    SOB (shortness of breath)     Past Surgical History:  Procedure Laterality Date   BRAVO PH STUDY N/A 02/13/2017   Procedure: BRAVO PH STUDY;  Surgeon: Vida Rigger, MD;  Location: [facility];  Service: Endoscopy;  Laterality: N/A;   COLONOSCOPY     ESOPHAGOGASTRODUODENOSCOPY (EGD) WITH PROPOFOL N/A 02/13/2017   Procedure: ESOPHAGOGASTRODUODENOSCOPY (EGD) WITH PROPOFOL;  Surgeon: Vida Rigger, MD;  Location: [facility];  Service: Endoscopy;  Laterality: N/A;   NASAL SINUS SURGERY  2017   UPPER GASTROINTESTINAL ENDOSCOPY      There were no vitals filed for this visit.    Subjective Assessment - 12/30/20 0854     Subjective Patient reports L shoulder pain started about 4 months ago, realized she couldn't bring her L hand back behind her back like the other.  Pain started gradually, no injury.  Went to orthopedist who gave her an injection but does not think that helped.    Pertinent History PHI- Asthma/COPD allergies, chronic cough    Limitations Lifting;House hold activities    Diagnostic tests X-ray - not available for review    Patient Stated Goals improve L shoulder ROM without pain    Currently in Pain?  Yes    Pain Score 0-No pain   6-7/10 reaching behind   Pain Location Shoulder    Pain Orientation Left    Pain Descriptors / Indicators Aching    Pain Type Chronic pain    Pain Radiating Towards localized to L posterior shoulder    Pain Onset More than a month ago   ~ 4 months ago   Pain Frequency Intermittent    Aggravating Factors  reaching behind, resistance with shoulder movements    Pain Relieving Factors hot pack    Effect of Pain on Daily Activities difficulty with dressing                [facility] PT Assessment - 12/30/20 0001       Assessment   Medical Diagnosis M25.519 (ICD-10-CM) - Shoulder pain    Referring Provider (PT) Madaline Brilliant MD    Onset Date/Surgical Date --   ~ 4 months ago   Hand Dominance Left    Next MD Visit will schedule follow up after therapy - about 6 weeks    Prior Therapy no      Precautions   Precautions None      Restrictions   Weight Bearing Restrictions No      Balance Screen   Has the patient fallen in the  past 6 months No    Has the patient had a decrease in activity level because of a fear of falling?  No    Is the patient reluctant to leave their home because of a fear of falling?  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)    Leisure walk      Cognition   Overall Cognitive Status Within Functional Limits for tasks assessed      Observation/Other Assessments   Observations enters independently with no apparent distress    Focus on Therapeutic Outcomes (FOTO)  63   72 predicted     Posture/Postural Control   Posture/Postural Control Postural limitations    Postural Limitations Rounded Shoulders;Forward head      ROM / Strength   AROM / PROM / Strength AROM;PROM;Strength      AROM   AROM Assessment Site Shoulder;Cervical    Right/Left Shoulder Right;Left    Right Shoulder Flexion 165 Degrees    Right Shoulder ABduction 170  Degrees    Right Shoulder Internal Rotation --   functional to T1   Right Shoulder External Rotation --   functional to T6   Left Shoulder Flexion 154 Degrees    Left Shoulder ABduction 170 Degrees    Left Shoulder Internal Rotation --   functional to T8   Left Shoulder External Rotation --   functional to T4   Cervical Flexion 40    Cervical Extension 30    Cervical - Right Rotation 60    Cervical - Left Rotation 65      PROM   Overall PROM Comments tested in supine    PROM Assessment Site Shoulder    Right/Left Shoulder Right;Left    Right Shoulder Flexion 180 Degrees    Right Shoulder ABduction 180 Degrees    Right Shoulder Internal Rotation 90 Degrees   shoulder in 45 deg abduction   Right Shoulder External Rotation 90 Degrees   shoulder in 45 deg abduction   Left Shoulder Flexion 155 Degrees    Left Shoulder ABduction 180 Degrees    Left Shoulder Internal Rotation 90 Degrees   shoulder at 45 deg abduction, mild discomfort   Left Shoulder External Rotation 55 Degrees   shoulder 45 deg abduction, very painful     Strength   Overall Strength Deficits    Overall Strength Comments 4/5 bil IR/ER strength with resistance, all other 5/5 shoulder/elbow strength.  Tested in sitting      Palpation   Spinal mobility decreased mobility in upper thoracic spine    Palpation comment tenderness over L UT/LS and infraspinatus                        Objective measurements completed on examination: See above findings.       [facility] Adult PT Treatment/Exercise - 12/30/20 0001       Exercises   Exercises Shoulder      Shoulder Exercises: Seated   Other Seated Exercises chin tucks x 10, retro shoulder rolls x 10, open book (R side only) x 10, scap squeezes x 10, corner pec stretch x 30 sec                     PT Education - 12/30/20 0936     Education Details educated on findings and POC.  Given initial HEP  Access Code: [email or web address]    Person(s) Educated Patient     Methods Explanation;Demonstration;Verbal cues;Handout    Comprehension Verbalized understanding;Returned demonstration;Need further instruction              PT Short Term Goals - 12/30/20 0944       PT SHORT TERM GOAL #1   Title Ind with initial HEP    Time 2    Period Weeks    Status New    Target Date 01/13/21               PT Long Term Goals - 12/30/20 0944       PT LONG TERM GOAL #1   Title Pt. will be independent with progressed HEP to improve outcomes.    Time 6    Period Weeks    Status New    Target Date 02/10/21      PT LONG TERM GOAL #2   Title Pt. will demonstrate full pain free L shoulder AROM to complete ADLs    Baseline see flow sheet    Time 6    Period Weeks    Status New    Target Date 02/10/21      PT LONG TERM GOAL #3   Title Pt. will report at least 72 on FOTO to demonstrate improved outcomes.    Baseline 63    Time 6    Period Weeks    Status New    Target Date 02/10/21      PT LONG TERM GOAL #4   Title Pt. will demonstrate 5/5 bil shoulder strength to complete all desired activities    Baseline 4/5 bil ER/IR    Time 6    Period Weeks    Status New    Target Date 02/10/21                    Plan - 12/30/20 9381     Clinical Impression Statement Calisha Tindel is a 55 year old female referred for L shoulder pain (RTC tendonitis/adhesive capusulitis).  She reports limitation and pain primarily with reaching behind back on L.  Today noted decreased strength in bil shoulders for IR/ER, decreased L shoulder ROM for functional IR and also passive ER, impaired posture and decreased periscapular strength.  She had pain with palpation over L infraspinatus, and some tenderness in L UT and levator scapulae.  She was instructed in initial HEP for posture today.  She would benefit from skilled physical therapy to decrease L shoulder pain and improve L shoulder ROM and posture.    Personal Factors and Comorbidities Comorbidity 1     Comorbidities severe asthma/COPD    Examination-Activity Limitations Dressing;Bend;Lift;Reach Overhead    Examination-Participation Restrictions Cleaning    Stability/Clinical Decision Making Stable/Uncomplicated    Clinical Decision Making Low    Rehab Potential Excellent    PT Frequency 2x / week    PT Duration 6 weeks    PT Treatment/Interventions ADLs/Self Care Home Management;Cryotherapy;Electrical Stimulation;Iontophoresis 4mg /ml Dexamethasone;Moist Heat;Ultrasound;Therapeutic activities;Therapeutic exercise;Neuromuscular re-education;Patient/family education;Manual techniques;Passive range of motion;Dry needling;Taping;Joint Manipulations;Spinal Manipulations    PT Next Visit Plan review and progress HEP for shoulder strengthening, manual therapy improve L shoulder ROM, modalities PRN    PT Home Exercise Plan    Consulted and Agree with Plan of Care Patient             Patient will benefit from skilled therapeutic intervention in order to improve the following deficits and impairments:  Decreased  endurance, Increased muscle spasms, Decreased range of motion, Decreased activity tolerance, Decreased strength, Increased fascial restricitons, Impaired flexibility, Impaired UE functional use, Postural dysfunction, Pain  Visit Diagnosis: Chronic left shoulder pain  Stiffness of left shoulder, not elsewhere classified  Muscle weakness (generalized)  Other muscle spasm     Problem List 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

## 2021-01-03 ENCOUNTER — Ambulatory Visit: Payer: 59

## 2021-01-05 ENCOUNTER — Ambulatory Visit: Payer: 59

## 2021-01-06 ENCOUNTER — Other Ambulatory Visit: Payer: Self-pay

## 2021-01-06 ENCOUNTER — Ambulatory Visit: Payer: 59 | Admitting: Physical Therapy

## 2021-01-06 ENCOUNTER — Encounter: Payer: Self-pay | Admitting: Physical Therapy

## 2021-01-06 DIAGNOSIS — M25512 Pain in left shoulder: Secondary | ICD-10-CM | POA: Diagnosis not present

## 2021-01-06 DIAGNOSIS — M25612 Stiffness of left shoulder, not elsewhere classified: Secondary | ICD-10-CM

## 2021-01-06 DIAGNOSIS — M6281 Muscle weakness (generalized): Secondary | ICD-10-CM

## 2021-01-06 DIAGNOSIS — G8929 Other chronic pain: Secondary | ICD-10-CM

## 2021-01-06 DIAGNOSIS — M62838 Other muscle spasm: Secondary | ICD-10-CM

## 2021-01-06 NOTE — Therapy (Signed)
[facility] Out[facility] Rehabilitation [facility] [address]  [address] [place], [place], [address] Phone: [phone]   Fax:  [phone]  Physical Therapy Treatment  Patient Details  Name: Sara Trevino MRN: [id number] Date of Birth: [birth year] Referring Provider (PT): Madaline Brilliant MD   Encounter Date: 01/06/2021   PT End of Session - 01/06/21 1318     Visit Number 2    Number of Visits 12    Date for PT Re-Evaluation 02/10/21    Authorization Type UHC VL 60    PT Start Time 1315    PT Stop Time 1400    PT Time Calculation (min) 45 min    Activity Tolerance Patient tolerated treatment well    Behavior During Therapy [facility] for tasks assessed/performed             Past Medical History:  Diagnosis Date   Allergy    Asthma    Cough    SOB (shortness of breath)     Past Surgical History:  Procedure Laterality Date   BRAVO PH STUDY N/A 02/13/2017   Procedure: BRAVO PH STUDY;  Surgeon: Vida Rigger, MD;  Location: [facility];  Service: Endoscopy;  Laterality: N/A;   COLONOSCOPY     ESOPHAGOGASTRODUODENOSCOPY (EGD) WITH PROPOFOL N/A 02/13/2017   Procedure: ESOPHAGOGASTRODUODENOSCOPY (EGD) WITH PROPOFOL;  Surgeon: Vida Rigger, MD;  Location: [facility];  Service: Endoscopy;  Laterality: N/A;   NASAL SINUS SURGERY  2017   UPPER GASTROINTESTINAL ENDOSCOPY      There were no vitals filed for this visit.   Subjective Assessment - 01/06/21 1317     Subjective Patient reports no change since IE.  She has been doing her HEP without difficulty.  L shoulder a little sore but no pain.    Pertinent History PHI- Asthma/COPD allergies, chronic cough    Limitations Lifting;House hold activities    Diagnostic tests X-ray - not available for review    Patient Stated Goals improve L shoulder ROM without pain    Currently in Pain? No/denies    Pain Onset More than a month ago   ~ 4 months ago                              [facility] Adult PT  Treatment/Exercise - 01/06/21 0001       Exercises   Exercises Shoulder      Shoulder Exercises: Standing   External Rotation Strengthening;Left;20 reps;Theraband    Theraband Level (Shoulder External Rotation) Level 1 (Yellow)    Internal Rotation Strengthening;Left;20 reps;Theraband    Theraband Level (Shoulder Internal Rotation) Level 1 (Yellow)    Internal Rotation Limitations roll under shoulder, cues for technique    Extension Strengthening;Both;20 reps;Theraband    Theraband Level (Shoulder Extension) Level 2 (Red)    Row Strengthening;Both;20 reps;Theraband    Theraband Level (Shoulder Row) Level 2 (Red)      Shoulder Exercises: ROM/Strengthening   UBE (Upper Arm Bike) L1 x 6 min (3 min f/3 min b) for warm-up and subjective information gathering    Ball on Wall CW x 20, CCW x 20 LUE      Manual Therapy   Manual Therapy Soft tissue mobilization;Myofascial release;Other (comment)    Manual therapy comments to L shoulder to decrease pain and spasm.    Soft tissue mobilization IASTM with s/s tool to L biceps    Myofascial Release TPR to L biceps/ant deltoid  Other Manual Therapy skilled palpation and monitoring with TrDN              Trigger Point Dry Needling - 01/06/21 0001     Consent Given? Yes    Education Handout Provided Yes    Muscles Treated Upper Quadrant Biceps;Deltoid   left   Dry Needling Comments to decrease pain and improve L shoulder ROM    Deltoid Response Twitch response elicited;Palpable increased muscle length    Biceps Response Twitch response elicited;Palpable increased muscle length                   PT Education - 01/06/21 1338     Education Details HEP progression Access Code: [email or web address].  Issued YTB.  Education on dry needling including risks, benefits, and aftercare.    Person(s) Educated Patient    Methods Explanation;Demonstration;Handout;Verbal cues    Comprehension Verbalized understanding;Returned demonstration               PT Short Term Goals - 01/06/21 1318       PT SHORT TERM GOAL #1   Title Ind with initial HEP    Time 2    Period Weeks    Status On-going    Target Date 01/13/21               PT Long Term Goals - 01/06/21 1319       PT LONG TERM GOAL #1   Title Pt. will be independent with progressed HEP to improve outcomes.    Time 6    Period Weeks    Status On-going    Target Date 02/10/21      PT LONG TERM GOAL #2   Title Pt. will demonstrate full pain free L shoulder AROM to complete ADLs    Baseline see flow sheet    Time 6    Period Weeks    Status On-going    Target Date 02/10/21      PT LONG TERM GOAL #3   Title Pt. will report at least 72 on FOTO to demonstrate improved outcomes.    Baseline 63    Time 6    Period Weeks    Status On-going    Target Date 02/10/21      PT LONG TERM GOAL #4   Title Pt. will demonstrate 5/5 bil shoulder strength to complete all desired activities    Baseline 4/5 bil ER/IR    Time 6    Period Weeks    Status On-going    Target Date 02/10/21                   Plan - 01/06/21 1638     Clinical Impression Statement Pt. reports no changes since IE and compliance with initial HEP.  Today focused on progressing HEP with theraband exercises for L shoulder strengthening.  YTB issued for exercises.  She had palpable trigger points in L shoulder along biceps and anterior delts, and after discussion/education about dry needling and risks, consented to dry needling, tolerated very well and reported decreased pulling in L shoulder.  She would benefit from continued skilled therapy.    Personal Factors and Comorbidities Comorbidity 1    Comorbidities severe asthma/COPD    Examination-Activity Limitations Dressing;Bend;Lift;Reach Overhead    Examination-Participation Restrictions Cleaning    Stability/Clinical Decision Making Stable/Uncomplicated    Rehab Potential Excellent    PT Frequency 2x / week    PT Duration 6 weeks  PT Treatment/Interventions ADLs/Self Care Home Management;Cryotherapy;Electrical Stimulation;Iontophoresis 4mg /ml Dexamethasone;Moist Heat;Ultrasound;Therapeutic activities;Therapeutic exercise;Neuromuscular re-education;Patient/family education;Manual techniques;Passive range of motion;Dry needling;Taping;Joint Manipulations;Spinal Manipulations    PT Next Visit Plan review and progress HEP for shoulder strengthening, manual therapy improve L shoulder ROM, modalities PRN    PT Home Exercise Plan    Consulted and Agree with Plan of Care Patient             Patient will benefit from skilled therapeutic intervention in order to improve the following deficits and impairments:  Decreased endurance, Increased muscle spasms, Decreased range of motion, Decreased activity tolerance, Decreased strength, Increased fascial restricitons, Impaired flexibility, Impaired UE functional use, Postural dysfunction, Pain  Visit Diagnosis: Chronic left shoulder pain  Stiffness of left shoulder, not elsewhere classified  Muscle weakness (generalized)  Other muscle spasm     Problem List 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 [address]  [address] [place], [place], [place] Phone: [phone]   Fax:  [phone]  Name: Sara Trevino MRN: [id number] Date of Birth: 1965/11/25

## 2021-01-06 NOTE — Patient Instructions (Addendum)
Access Code: [email or web address] URL: [email or web address] Date: 01/06/2021 Prepared by: Harrie Foreman  Exercises  Standing Shoulder Row with Anchored Resistance - 1 x daily - 7 x weekly - 3 sets - 10 reps Shoulder extension with resistance - Neutral - 1 x daily - 7 x weekly - 3 sets - 10 reps Shoulder Internal Rotation with Resistance - 1 x daily - 7 x weekly - 3 sets - 10 reps Shoulder External Rotation with Anchored Resistance - 1 x daily - 7 x weekly - 3 sets - 10 reps   Trigger Point Dry Needling  What is Trigger Point Dry Needling (DN)? DN is a physical therapy technique used to treat muscle pain and dysfunction. Specifically, DN helps deactivate muscle trigger points (muscle knots).  A thin filiform needle is used to penetrate the skin and stimulate the underlying trigger point. The goal is for a local twitch response (LTR) to occur and for the trigger point to relax. No medication of any kind is injected during the procedure.   What Does Trigger Point Dry Needling Feel Like?  The procedure feels different for each individual patient. Some patients report that they do not actually feel the needle enter the skin and overall the process is not painful. Very mild bleeding may occur. However, many patients feel a deep cramping in the muscle in which the needle was inserted. This is the local twitch response.   How Will I feel after the treatment? Soreness is normal, and the onset of soreness may not occur for a few hours. Typically this soreness does not last longer than two days.  Bruising is uncommon, however; ice can be used to decrease any possible bruising.  In rare cases feeling tired or nauseous after the treatment is normal. In addition, your symptoms may get worse before they get better, this period will typically not last longer than 24 hours.   What Can I do After My Treatment? Increase your hydration by drinking more water for the next 24 hours. You may place ice  or heat on the areas treated that have become sore, however, do not use heat on inflamed or bruised areas. Heat often brings more relief post needling. You can continue your regular activities, but vigorous activity is not recommended initially after the treatment for 24 hours. DN is best combined with other physical therapy such as strengthening, stretching, and other therapies.

## 2021-01-12 ENCOUNTER — Ambulatory Visit: Payer: 59

## 2021-01-12 ENCOUNTER — Other Ambulatory Visit: Payer: Self-pay

## 2021-01-12 DIAGNOSIS — M25512 Pain in left shoulder: Secondary | ICD-10-CM

## 2021-01-12 DIAGNOSIS — M6281 Muscle weakness (generalized): Secondary | ICD-10-CM

## 2021-01-12 DIAGNOSIS — M25612 Stiffness of left shoulder, not elsewhere classified: Secondary | ICD-10-CM

## 2021-01-12 DIAGNOSIS — M62838 Other muscle spasm: Secondary | ICD-10-CM

## 2021-01-12 NOTE — Therapy (Signed)
[facility] [address]  [address] [place], [place], [address] Phone: [phone]   Fax:  [phone]  Physical Therapy Treatment  Patient Details  Name: Sara Trevino MRN: [id number] Date of Birth: 10/29/1965 Referring Provider (PT): Madaline Brilliant MD   Encounter Date: 01/12/2021   PT End of Session - 01/12/21 1401     Visit Number 3    Number of Visits 12    Date for PT Re-Evaluation 02/10/21    Authorization Type UHC VL [id number]    PT Start Time 1315    PT Stop Time 1401    PT Time Calculation (min) 46 min    Activity Tolerance Patient tolerated treatment well    Behavior During Therapy WFL for tasks assessed/performed             Past Medical History:  Diagnosis Date   Allergy    Asthma    Cough    SOB (shortness of breath)     Past Surgical History:  Procedure Laterality Date   BRAVO PH STUDY N/A 02/13/2017   Procedure: BRAVO PH STUDY;  Surgeon: Vida Rigger, MD;  Location: [facility];  Service: Endoscopy;  Laterality: N/A;   COLONOSCOPY     ESOPHAGOGASTRODUODENOSCOPY (EGD) WITH PROPOFOL N/A 02/13/2017   Procedure: ESOPHAGOGASTRODUODENOSCOPY (EGD) WITH PROPOFOL;  Surgeon: Vida Rigger, MD;  Location: [facility];  Service: Endoscopy;  Laterality: N/A;   NASAL SINUS SURGERY  2017   UPPER GASTROINTESTINAL ENDOSCOPY      There were no vitals filed for this visit.   Subjective Assessment - 01/12/21 1318     Subjective "My shoulder feels better than before but still there are things I have trouble with like reaching behind the back."    Pertinent History PHI- Asthma/COPD allergies, chronic cough    Diagnostic tests X-ray - not available for review    Patient Stated Goals improve L shoulder ROM without pain    Currently in Pain? Yes    Pain Score 6     Pain Location Shoulder    Pain Orientation Left    Pain Descriptors / Indicators Aching    Pain Type Chronic pain                                [facility] Adult PT Treatment/Exercise - 01/12/21 0001       Neck Exercises: Stretches   Upper Trapezius Stretch Left;30 seconds    Levator Stretch Left;30 seconds      Shoulder Exercises: Seated   Other Seated Exercises thoracic extension over chair 10x2"      Shoulder Exercises: Standing   External Rotation Strengthening;Left;10 reps;Theraband    Theraband Level (Shoulder External Rotation) Level 2 (Red)    Internal Rotation Strengthening;Left;10 reps;Theraband    Theraband Level (Shoulder Internal Rotation) Level 2 (Red)    Extension Strengthening;Both;Theraband;20 reps    Theraband Level (Shoulder Extension) Level 2 (Red)    Row Strengthening;Both;Theraband;20 reps    Theraband Level (Shoulder Row) Level 2 (Red)    Other Standing Exercises B ER with back against doorframe for scap retraction (red TB) 10x      Shoulder Exercises: ROM/Strengthening   UBE (Upper Arm Bike) L1 x 6 min (3 min f/3 min b)    Wall Wash into flexion and scaption with LUE 10x      Shoulder Exercises: Stretch   Other Shoulder Stretches L infraspinatus stretch 30 sec  Manual Therapy   Manual Therapy Soft tissue mobilization    Manual therapy comments in sitting    Soft tissue mobilization STM to L UT, levator, infraspinatus, teres group                       PT Short Term Goals - 01/12/21 1413       PT SHORT TERM GOAL #1   Title Ind with initial HEP    Time 2    Period Weeks    Status Achieved   01/12/21   Target Date 01/13/21               PT Long Term Goals - 01/06/21 1319       PT LONG TERM GOAL #1   Title Pt. will be independent with progressed HEP to improve outcomes.    Time 6    Period Weeks    Status On-going    Target Date 02/10/21      PT LONG TERM GOAL #2   Title Pt. will demonstrate full pain free L shoulder AROM to complete ADLs    Baseline see flow sheet    Time 6    Period Weeks    Status On-going    Target  Date 02/10/21      PT LONG TERM GOAL #3   Title Pt. will report at least 72 on FOTO to demonstrate improved outcomes.    Baseline 63    Time 6    Period Weeks    Status On-going    Target Date 02/10/21      PT LONG TERM GOAL #4   Title Pt. will demonstrate 5/5 bil shoulder strength to complete all desired activities    Baseline 4/5 bil ER/IR    Time 6    Period Weeks    Status On-going    Target Date 02/10/21                   Plan - 01/12/21 1412     Clinical Impression Statement Pt was able to complete all interventions today with no increased pain. She did have some pulling along the L side of her neck with the wall wash, so we did some STM. TTP and restrictions found in the L infraspinatus, levator, and teres group. I showed her how she could self massage at home as well. Cues needed for proper form with the rows and B ER with TB. Finished session w/o any complaints or increased pain.    Personal Factors and Comorbidities Comorbidity 1    Comorbidities severe asthma/COPD    PT Frequency 2x / week    PT Duration 6 weeks    PT Treatment/Interventions ADLs/Self Care Home Management;Cryotherapy;Electrical Stimulation;Iontophoresis 4mg /ml Dexamethasone;Moist Heat;Ultrasound;Therapeutic activities;Therapeutic exercise;Neuromuscular re-education;Patient/family education;Manual techniques;Passive range of motion;Dry needling;Taping;Joint Manipulations;Spinal Manipulations    PT Next Visit Plan review and progress HEP for shoulder strengthening, manual therapy improve L shoulder ROM, modalities PRN    PT Home Exercise Plan    Consulted and Agree with Plan of Care Patient             Patient will benefit from skilled therapeutic intervention in order to improve the following deficits and impairments:  Decreased endurance, Increased muscle spasms, Decreased range of motion, Decreased activity tolerance, Decreased strength, Increased fascial restricitons, Impaired  flexibility, Impaired UE functional use, Postural dysfunction, Pain  Visit Diagnosis: Chronic left shoulder pain  Stiffness of left shoulder, not elsewhere classified  Muscle  weakness (generalized)  Other muscle spasm     Problem List 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  [address] [place], [place], [address] Phone: [phone]   Fax:  [phone]  Name: Lucile Hillmann MRN: [id number] Date of Birth: 1965-07-15

## 2021-01-13 ENCOUNTER — Telehealth: Payer: Self-pay | Admitting: Pharmacy Technician

## 2021-01-13 NOTE — Telephone Encounter (Signed)
Xolair Co-Pay Assistance: [phone]  Patient has exhausted allowed funds for the year of 2022. Medication coverage: $15,000 Injection coverage: $1500 Last covered treatment was 12/07/20, all other treatments after 12/07/20 will be billed to patient. Letter of exhaustion was mailed to patient per program rep Rodney Booze.-G) Patient will automatically be re-enrolled for 2023. (Program will auto-reset) Letter of exhaustion will be faxed to [facility] [phone].  (Fyi: Prior auth expiration date 04/23/21).

## 2021-01-14 ENCOUNTER — Encounter: Payer: 59 | Admitting: Physical Therapy

## 2021-01-19 ENCOUNTER — Encounter: Payer: 59 | Admitting: Physical Therapy

## 2021-01-19 ENCOUNTER — Ambulatory Visit: Payer: 59

## 2021-01-20 ENCOUNTER — Other Ambulatory Visit: Payer: Self-pay

## 2021-01-20 ENCOUNTER — Ambulatory Visit (INDEPENDENT_AMBULATORY_CARE_PROVIDER_SITE_OTHER): Payer: 59

## 2021-01-20 ENCOUNTER — Encounter: Payer: 59 | Admitting: Physical Therapy

## 2021-01-20 VITALS — BP 119/71 | HR 77 | Temp 98.2°F | Resp 16 | Ht 62.0 in | Wt 99.2 lb

## 2021-01-20 DIAGNOSIS — J449 Chronic obstructive pulmonary disease, unspecified: Secondary | ICD-10-CM | POA: Diagnosis not present

## 2021-01-20 MED ORDER — FAMOTIDINE IN NACL 20-0.9 MG/50ML-% IV SOLN: 20.0000 mg | Freq: Once | INTRAVENOUS | Status: DC | PRN

## 2021-01-20 MED ORDER — DIPHENHYDRAMINE HCL 50 MG/ML IJ SOLN: 50.0000 mg | Freq: Once | INTRAMUSCULAR | Status: DC | PRN

## 2021-01-20 MED ORDER — EPINEPHRINE 0.3 MG/0.3ML IJ SOAJ: 0.3000 mg | Freq: Once | INTRAMUSCULAR | Status: DC | PRN

## 2021-01-20 MED ORDER — SODIUM CHLORIDE 0.9 % IV SOLN: Freq: Once | INTRAVENOUS | Status: DC | PRN

## 2021-01-20 MED ORDER — OMALIZUMAB 75 MG/0.5ML ~~LOC~~ SOSY
375.0000 mg | PREFILLED_SYRINGE | Freq: Once | SUBCUTANEOUS | Status: AC
  Administered 2021-01-20: 375 mg via SUBCUTANEOUS
  Filled 2021-01-20: qty 0.5

## 2021-01-20 MED ORDER — ALBUTEROL SULFATE HFA 108 (90 BASE) MCG/ACT IN AERS: 2.0000 | INHALATION_SPRAY | Freq: Once | RESPIRATORY_TRACT | Status: DC | PRN

## 2021-01-20 MED ORDER — METHYLPREDNISOLONE SODIUM SUCC 125 MG IJ SOLR: 125.0000 mg | Freq: Once | INTRAMUSCULAR | Status: DC | PRN

## 2021-01-20 NOTE — Progress Notes (Signed)
Diagnosis: Asthma  Provider:  Praveen Mannam, MD  Procedure: Injection  Xolair (Omalizumab), Dose: 375 mg, Site: subcutaneous, Number of injections: 3  Discharge: Condition: Good, Destination: Home . AVS provided to patient.   Performed by:  Ailine Hefferan, RN       

## 2021-01-26 ENCOUNTER — Ambulatory Visit: Payer: 59 | Attending: Nephrology

## 2021-01-26 ENCOUNTER — Other Ambulatory Visit: Payer: Self-pay

## 2021-01-26 DIAGNOSIS — G8929 Other chronic pain: Secondary | ICD-10-CM | POA: Insufficient documentation

## 2021-01-26 DIAGNOSIS — M6281 Muscle weakness (generalized): Secondary | ICD-10-CM | POA: Insufficient documentation

## 2021-01-26 DIAGNOSIS — M25612 Stiffness of left shoulder, not elsewhere classified: Secondary | ICD-10-CM | POA: Insufficient documentation

## 2021-01-26 DIAGNOSIS — M25512 Pain in left shoulder: Secondary | ICD-10-CM | POA: Diagnosis not present

## 2021-01-26 DIAGNOSIS — M62838 Other muscle spasm: Secondary | ICD-10-CM | POA: Insufficient documentation

## 2021-01-26 NOTE — Therapy (Addendum)
PHYSICAL THERAPY DISCHARGE SUMMARY  Visits from Start of Care: 4   Current functional level related to goals / functional outcomes: See note below   Remaining deficits: Continued L shoulder pain   Education / Equipment: HEP  Plan:   Patient goals were not met. Patient is being discharged due to not attending therapy since 01/26/2021, was concerned about bill from insurance and declined to schedule more.      Rennie Natter, PT, DPT 03/15/21   [facility] [address]  [place], [place], [address] Phone: [phone]   Fax:  [phone]  Physical Therapy Treatment  Patient Details  Name: Sara Trevino MRN: [id number] Date of Birth: [birth year] Referring Provider (PT): Tora Kindred MD   Encounter Date: 01/26/2021   PT End of Session - 01/26/21 1749     Visit Number 4    Number of Visits 12    Date for PT Re-Evaluation 02/10/21    Authorization Type UHC VL 60    PT Start Time 1703    PT Stop Time 0037    PT Time Calculation (min) 40 min    Activity Tolerance Patient tolerated treatment well    Behavior During Therapy [facility] for tasks assessed/performed             Past Medical History:  Diagnosis Date   Allergy    Asthma    Cough    SOB (shortness of breath)     Past Surgical History:  Procedure Laterality Date   BRAVO [place] STUDY N/A 02/13/2017   Procedure: BRAVO [place];  Surgeon: Clarene Essex, MD;  [facility];  Service: Endoscopy;  Laterality: N/A;   COLONOSCOPY     ESOPHAGOGASTRODUODENOSCOPY (EGD) WITH PROPOFOL N/A 02/13/2017   Procedure: ESOPHAGOGASTRODUODENOSCOPY (EGD) WITH PROPOFOL;  Surgeon: Clarene Essex, MD;  Location: [facility];  Service: Endoscopy;  Laterality: N/A;   NASAL SINUS SURGERY  2017   UPPER GASTROINTESTINAL ENDOSCOPY      There were no vitals filed for this visit.   Subjective Assessment - 01/26/21 1706     Subjective Still having trouble reaching behind  her back, pain now went from shoulder blade to arm.    Pertinent History PHI- Asthma/COPD allergies, chronic cough    Diagnostic tests X-ray - not available for review    Patient Stated Goals improve L shoulder ROM without pain    Currently in Pain? Yes    Pain Score 5     Pain Location Shoulder    Pain Orientation Left    Pain Descriptors / Indicators Aching    Pain Type Chronic pain                               [facility] Adult PT Treatment/Exercise - 01/26/21 0001       Neck Exercises: Stretches   Other Neck Stretches thoracic ext and rotation 10x2"      Shoulder Exercises: Prone   Flexion Strengthening;Left;10 reps;Weights    Flexion Weight (lbs) 2    Extension AROM;20 reps;Left    Other Prone Exercises serratus push ups on knees 20 reps    Other Prone Exercises row with 2lb weight 10 reps      Shoulder Exercises: Sidelying   External Rotation Strengthening;Left;10 reps;Weights    External Rotation Weight (lbs) 2      Shoulder Exercises: ROM/Strengthening   UBE (Upper Arm Bike) L1 x 6  min (3 min f/3 min b)      Manual Therapy   Manual Therapy Soft tissue mobilization    Manual therapy comments in sitting    Soft tissue mobilization STM to L biceps, lower rhomboids. lats                       PT Short Term Goals - 01/12/21 1413       PT SHORT TERM GOAL #1   Title Ind with initial HEP    Time 2    Period Weeks    Status Achieved   01/12/21   Target Date 01/13/21               PT Long Term Goals - 01/06/21 1319       PT LONG TERM GOAL #1   Title Pt. will be independent with progressed HEP to improve outcomes.    Time 6    Period Weeks    Status On-going    Target Date 02/10/21      PT LONG TERM GOAL #2   Title Pt. will demonstrate full pain free L shoulder AROM to complete ADLs    Baseline see flow sheet    Time 6    Period Weeks    Status On-going    Target Date 02/10/21      PT LONG TERM GOAL #3   Title Pt.  will report at least 33 on FOTO to demonstrate improved outcomes.    Baseline 63    Time 6    Period Weeks    Status On-going    Target Date 02/10/21      PT LONG TERM GOAL #4   Title Pt. will demonstrate 5/5 bil shoulder strength to complete all desired activities    Baseline 4/5 bil ER/IR    Time 6    Period Weeks    Status On-going    Target Date 02/10/21                   Plan - 01/26/21 1749     Clinical Impression Statement Pt noted pain shift from scapula to bicep area. We did some soft tissue work to decrease the pain and stiffness. After we worked in prone on the scapular stabilizers, providing tactile and verbal cues for technique. Lots of instruction required for the correct positioning with S/L ER. Pt still limited with behind back reaching due to pain. She had a good response to the treatment today.    Personal Factors and Comorbidities Comorbidity 1    Comorbidities severe asthma/COPD    PT Frequency 2x / week    PT Duration 6 weeks    PT Treatment/Interventions ADLs/Self Care Home Management;Cryotherapy;Electrical Stimulation;Iontophoresis 66m/ml Dexamethasone;Moist Heat;Ultrasound;Therapeutic activities;Therapeutic exercise;Neuromuscular re-education;Patient/family education;Manual techniques;Passive range of motion;Dry needling;Taping;Joint Manipulations;Spinal Manipulations    PT Next Visit Plan progress shoulder strengthening, manual therapy improve L shoulder ROM, modalities PRN    PT Home Exercise Plan [id number]   Consulted and Agree with Plan of Care Patient             Patient will benefit from skilled therapeutic intervention in order to improve the following deficits and impairments:  Decreased endurance, Increased muscle spasms, Decreased range of motion, Decreased activity tolerance, Decreased strength, Increased fascial restricitons, Impaired flexibility, Impaired UE functional use, Postural dysfunction, Pain  Visit Diagnosis: Chronic left  shoulder pain  Stiffness of left shoulder, not elsewhere classified  Muscle weakness (generalized)  Other muscle  spasm     Problem List Patient Active Problem List   Diagnosis Date Noted   Bronchiectasis without complication ([place]) 44/69/5072   (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

## 2021-01-31 ENCOUNTER — Ambulatory Visit: Payer: 59 | Admitting: Physical Therapy

## 2021-02-02 ENCOUNTER — Ambulatory Visit: Payer: 59

## 2021-02-08 ENCOUNTER — Encounter: Payer: Self-pay | Admitting: Internal Medicine

## 2021-02-08 NOTE — Assessment & Plan Note (Signed)
Not seen anteriorly

## 2021-02-08 NOTE — Assessment & Plan Note (Signed)
Active cough and audible chest congestion are much improved with Xolair. Plan-continue Xolair.  Discuss dosing interval with infusion clinic.  Flu vaccine

## 2021-02-14 ENCOUNTER — Other Ambulatory Visit: Payer: Self-pay | Admitting: Pharmacy Technician

## 2021-02-15 ENCOUNTER — Ambulatory Visit: Payer: 59

## 2021-02-16 ENCOUNTER — Ambulatory Visit: Payer: 59

## 2021-02-17 ENCOUNTER — Ambulatory Visit: Payer: 59

## 2021-02-22 ENCOUNTER — Other Ambulatory Visit: Payer: Self-pay | Admitting: Internal Medicine

## 2021-02-25 ENCOUNTER — Ambulatory Visit: Payer: 59

## 2021-03-02 ENCOUNTER — Ambulatory Visit: Payer: 59

## 2021-03-03 ENCOUNTER — Ambulatory Visit: Payer: 59

## 2021-03-04 ENCOUNTER — Ambulatory Visit (INDEPENDENT_AMBULATORY_CARE_PROVIDER_SITE_OTHER): Payer: 59

## 2021-03-04 ENCOUNTER — Other Ambulatory Visit: Payer: Self-pay

## 2021-03-04 VITALS — BP 100/63 | HR 71 | Temp 97.7°F | Resp 20 | Ht 62.0 in | Wt 102.0 lb

## 2021-03-04 DIAGNOSIS — J449 Chronic obstructive pulmonary disease, unspecified: Secondary | ICD-10-CM | POA: Diagnosis not present

## 2021-03-04 MED ORDER — EPINEPHRINE 0.3 MG/0.3ML IJ SOAJ: 0.3000 mg | Freq: Once | INTRAMUSCULAR | Status: DC | PRN

## 2021-03-04 MED ORDER — OMALIZUMAB 75 MG/0.5ML ~~LOC~~ SOSY
375.0000 mg | PREFILLED_SYRINGE | Freq: Once | SUBCUTANEOUS | Status: AC
  Administered 2021-03-04: 375 mg via SUBCUTANEOUS
  Filled 2021-03-04: qty 0.5

## 2021-03-04 MED ORDER — FAMOTIDINE IN NACL 20-0.9 MG/50ML-% IV SOLN: 20.0000 mg | Freq: Once | INTRAVENOUS | Status: DC | PRN

## 2021-03-04 MED ORDER — METHYLPREDNISOLONE SODIUM SUCC 125 MG IJ SOLR: 125.0000 mg | Freq: Once | INTRAMUSCULAR | Status: DC | PRN

## 2021-03-04 MED ORDER — SODIUM CHLORIDE 0.9 % IV SOLN: Freq: Once | INTRAVENOUS | Status: DC | PRN

## 2021-03-04 MED ORDER — DIPHENHYDRAMINE HCL 50 MG/ML IJ SOLN: 50.0000 mg | Freq: Once | INTRAMUSCULAR | Status: DC | PRN

## 2021-03-04 MED ORDER — ALBUTEROL SULFATE HFA 108 (90 BASE) MCG/ACT IN AERS: 2.0000 | INHALATION_SPRAY | Freq: Once | RESPIRATORY_TRACT | Status: DC | PRN

## 2021-03-04 NOTE — Progress Notes (Signed)
Diagnosis: Asthma ° °Provider:  Praveen Mannam, MD ° °Procedure: Injection ° °Xolair (Omalizumab), Dose: 375 mg, Site: subcutaneous, Number of injections: 3 ° °Discharge: Condition: Good, Destination: Home . AVS provided to patient.  ° °Performed by:  Marquis Diles, RN  ° ° ° °  °

## 2021-03-16 ENCOUNTER — Ambulatory Visit: Payer: 59

## 2021-03-17 ENCOUNTER — Ambulatory Visit: Payer: 59

## 2021-03-25 ENCOUNTER — Ambulatory Visit: Payer: 59

## 2021-03-29 ENCOUNTER — Telehealth: Payer: Self-pay

## 2021-03-29 NOTE — Telephone Encounter (Signed)
Received faxed [place] renewal form from [facility] for Xolair. Completed form and faxed back along with supporting chart notes. Will await determination. ?

## 2021-03-30 ENCOUNTER — Ambulatory Visit: Payer: 59

## 2021-03-31 ENCOUNTER — Other Ambulatory Visit: Payer: Self-pay

## 2021-03-31 ENCOUNTER — Ambulatory Visit (INDEPENDENT_AMBULATORY_CARE_PROVIDER_SITE_OTHER): Payer: 59

## 2021-03-31 VITALS — BP 107/68 | HR 69 | Temp 97.6°F | Resp 18 | Ht 62.0 in | Wt 102.8 lb

## 2021-03-31 DIAGNOSIS — J449 Chronic obstructive pulmonary disease, unspecified: Secondary | ICD-10-CM | POA: Diagnosis not present

## 2021-03-31 MED ORDER — OMALIZUMAB 75 MG/0.5ML ~~LOC~~ SOSY
375.0000 mg | PREFILLED_SYRINGE | Freq: Once | SUBCUTANEOUS | Status: AC
  Administered 2021-03-31: 375 mg via SUBCUTANEOUS
  Filled 2021-03-31: qty 0.5

## 2021-03-31 NOTE — Progress Notes (Signed)
Diagnosis: Asthma ° °Provider:  Praveen Mannam, MD ° °Procedure: Injection ° °Xolair (Omalizumab), Dose: 375 mg, Site: subcutaneous, Number of injections: 3 ° °Discharge: Condition: Good, Destination: Home . AVS provided to patient.  ° °Performed by:  Chontel Warning, RN  ° ° ° °  °

## 2021-03-31 NOTE — Telephone Encounter (Signed)
Received notification from [facility] regarding a prior authorization for XOLAIR. Authorization has been APPROVED from 03/30/2021 to 03/30/2022. Approval letter sent to scan center. ? ?Authorization # [id number] ?

## 2021-04-01 ENCOUNTER — Ambulatory Visit: Payer: 59

## 2021-04-13 ENCOUNTER — Ambulatory Visit: Payer: 59

## 2021-04-27 ENCOUNTER — Ambulatory Visit: Payer: 59

## 2021-04-29 ENCOUNTER — Ambulatory Visit: Payer: 59

## 2021-05-06 ENCOUNTER — Ambulatory Visit (INDEPENDENT_AMBULATORY_CARE_PROVIDER_SITE_OTHER): Payer: 59

## 2021-05-06 ENCOUNTER — Other Ambulatory Visit: Payer: Self-pay | Admitting: Pharmacy Technician

## 2021-05-06 VITALS — BP 97/62 | HR 71 | Temp 97.8°F | Resp 16 | Ht 62.0 in | Wt 104.0 lb

## 2021-05-06 DIAGNOSIS — J449 Chronic obstructive pulmonary disease, unspecified: Secondary | ICD-10-CM | POA: Diagnosis not present

## 2021-05-06 MED ORDER — OMALIZUMAB 75 MG/0.5ML ~~LOC~~ SOSY
375.0000 mg | PREFILLED_SYRINGE | Freq: Once | SUBCUTANEOUS | Status: AC
  Administered 2021-05-06: 375 mg via SUBCUTANEOUS
  Filled 2021-05-06: qty 0.5

## 2021-05-06 NOTE — Progress Notes (Signed)
Diagnosis: Asthma ° °Provider:  Praveen Mannam, MD ° °Procedure: Injection ° °Xolair (Omalizumab), Dose: 375 mg, Site: subcutaneous, Number of injections: 3 ° °Discharge: Condition: Good, Destination: Home . AVS provided to patient.  ° °Performed by:  Mccade Sullenberger, RN  ° ° ° °  °

## 2021-05-13 ENCOUNTER — Ambulatory Visit: Payer: 59

## 2021-05-20 ENCOUNTER — Ambulatory Visit: Payer: 59

## 2021-06-03 ENCOUNTER — Ambulatory Visit (INDEPENDENT_AMBULATORY_CARE_PROVIDER_SITE_OTHER): Payer: 59

## 2021-06-03 VITALS — BP 112/68 | HR 72 | Temp 97.9°F | Resp 16 | Ht 62.0 in | Wt 105.0 lb

## 2021-06-03 DIAGNOSIS — J449 Chronic obstructive pulmonary disease, unspecified: Secondary | ICD-10-CM | POA: Diagnosis not present

## 2021-06-03 MED ORDER — OMALIZUMAB 75 MG/0.5ML ~~LOC~~ SOSY
375.0000 mg | PREFILLED_SYRINGE | Freq: Once | SUBCUTANEOUS | Status: AC
  Administered 2021-06-03: 375 mg via SUBCUTANEOUS
  Filled 2021-06-03: qty 0.5

## 2021-06-03 NOTE — Progress Notes (Signed)
Diagnosis: Asthma ° °Provider:  Praveen Mannam, MD ° °Procedure: Injection ° °Xolair (Omalizumab), Dose: 375 mg, Site: subcutaneous, Number of injections: 3 ° °Discharge: Condition: Good, Destination: Home . AVS provided to patient.  ° °Performed by:  Sharrod Achille, RN  ° ° ° °  °

## 2021-07-01 ENCOUNTER — Ambulatory Visit (INDEPENDENT_AMBULATORY_CARE_PROVIDER_SITE_OTHER): Payer: 59 | Admitting: *Deleted

## 2021-07-01 VITALS — BP 116/70 | HR 91 | Temp 98.0°F | Resp 18 | Ht 62.0 in | Wt 104.6 lb

## 2021-07-01 DIAGNOSIS — J449 Chronic obstructive pulmonary disease, unspecified: Secondary | ICD-10-CM | POA: Diagnosis not present

## 2021-07-01 MED ORDER — OMALIZUMAB 75 MG/0.5ML ~~LOC~~ SOSY
375.0000 mg | PREFILLED_SYRINGE | Freq: Once | SUBCUTANEOUS | Status: AC
  Administered 2021-07-01: 375 mg via SUBCUTANEOUS
  Filled 2021-07-01: qty 0.5

## 2021-07-01 NOTE — Progress Notes (Signed)
Diagnosis: Asthma ° °Provider:  Praveen Mannam, MD ° °Procedure: Injection ° °Xolair (Omalizumab), Dose: 375 mg, Site: subcutaneous, Number of injections: 3 ° °Discharge: Condition: Good, Destination: Home . AVS provided to patient.  ° °Performed by:  Roseline Ebarb A, RN  ° ° ° °  °

## 2021-07-29 ENCOUNTER — Ambulatory Visit: Payer: 59 | Admitting: Internal Medicine

## 2021-07-29 ENCOUNTER — Ambulatory Visit (INDEPENDENT_AMBULATORY_CARE_PROVIDER_SITE_OTHER): Payer: 59

## 2021-07-29 VITALS — BP 106/69 | HR 67 | Temp 98.3°F | Resp 18 | Ht 62.0 in | Wt 105.4 lb

## 2021-07-29 DIAGNOSIS — J449 Chronic obstructive pulmonary disease, unspecified: Secondary | ICD-10-CM | POA: Diagnosis not present

## 2021-07-29 MED ORDER — OMALIZUMAB 75 MG/0.5ML ~~LOC~~ SOSY
375.0000 mg | PREFILLED_SYRINGE | Freq: Once | SUBCUTANEOUS | Status: AC
  Administered 2021-07-29: 375 mg via SUBCUTANEOUS
  Filled 2021-07-29: qty 0.5

## 2021-07-29 NOTE — Progress Notes (Signed)
Diagnosis: Asthma  Provider:  Chilton Greathouse, MD  Procedure: Injection  Xolair (Omalizumab), Dose: 375 mg, Site: subcutaneous, Number of injections: 3  Discharge: Condition: Good, Destination: Home . AVS provided to patient.   Performed by:  Nat Math, RN

## 2021-08-18 ENCOUNTER — Other Ambulatory Visit: Payer: Self-pay | Admitting: Obstetrics and Gynecology

## 2021-09-02 ENCOUNTER — Ambulatory Visit (INDEPENDENT_AMBULATORY_CARE_PROVIDER_SITE_OTHER): Payer: 59 | Admitting: *Deleted

## 2021-09-02 VITALS — BP 113/70 | HR 79 | Temp 98.2°F | Resp 18 | Ht 62.0 in | Wt 105.2 lb

## 2021-09-02 DIAGNOSIS — J449 Chronic obstructive pulmonary disease, unspecified: Secondary | ICD-10-CM | POA: Diagnosis not present

## 2021-09-02 MED ORDER — OMALIZUMAB 75 MG/0.5ML ~~LOC~~ SOSY
375.0000 mg | PREFILLED_SYRINGE | Freq: Once | SUBCUTANEOUS | Status: AC
  Administered 2021-09-02: 375 mg via SUBCUTANEOUS
  Filled 2021-09-02: qty 0.5

## 2021-09-02 NOTE — Progress Notes (Signed)
Diagnosis: Asthma  Provider:  Praveen Mannam MD  Procedure: Injection  Xolair (Omalizumab), Dose: 375 mg, Site: subcutaneous, Number of injections: 3  Discharge: Condition: Good, Destination: Home . AVS provided to patient.   Performed by:  Mazal Ebey A, RN       

## 2021-09-16 ENCOUNTER — Other Ambulatory Visit: Payer: Self-pay | Admitting: Obstetrics and Gynecology

## 2021-09-16 DIAGNOSIS — Z1231 Encounter for screening mammogram for malignant neoplasm of breast: Secondary | ICD-10-CM

## 2021-09-30 ENCOUNTER — Ambulatory Visit (INDEPENDENT_AMBULATORY_CARE_PROVIDER_SITE_OTHER): Payer: 59 | Admitting: *Deleted

## 2021-09-30 VITALS — BP 93/60 | HR 80 | Temp 97.9°F | Resp 16 | Ht 62.0 in | Wt 105.0 lb

## 2021-09-30 DIAGNOSIS — J449 Chronic obstructive pulmonary disease, unspecified: Secondary | ICD-10-CM | POA: Diagnosis not present

## 2021-09-30 MED ORDER — OMALIZUMAB 75 MG/0.5ML ~~LOC~~ SOSY
375.0000 mg | PREFILLED_SYRINGE | Freq: Once | SUBCUTANEOUS | Status: AC
  Administered 2021-09-30: 375 mg via SUBCUTANEOUS
  Filled 2021-09-30: qty 0.5

## 2021-09-30 NOTE — Progress Notes (Signed)
Diagnosis: Asthma  Provider:  Praveen Mannam MD  Procedure: Injection  Xolair (Omalizumab), Dose: 375 mg, Site: subcutaneous, Number of injections: 3  Discharge: Condition: Good, Destination: Home . AVS provided to patient.   Performed by:  Khaiden Segreto A, RN       

## 2021-10-21 ENCOUNTER — Ambulatory Visit: Payer: 59

## 2021-10-27 ENCOUNTER — Telehealth: Payer: Self-pay | Admitting: Internal Medicine

## 2021-10-27 DIAGNOSIS — J4550 Severe persistent asthma, uncomplicated: Secondary | ICD-10-CM

## 2021-10-27 MED ORDER — OMALIZUMAB 75 MG/0.5ML ~~LOC~~ SOSY: 75.0000 mg | PREFILLED_SYRINGE | SUBCUTANEOUS | 1 refills | Status: DC

## 2021-10-27 MED ORDER — OMALIZUMAB 150 MG/ML ~~LOC~~ SOSY: 300.0000 mg | PREFILLED_SYRINGE | SUBCUTANEOUS | 1 refills | Status: DC

## 2021-10-27 NOTE — Telephone Encounter (Signed)
Refill for Xolair sent to [facility] for 52month supply.  Dose: 375mg  SQ every 4 weeks  Patient has appt on 11/21/21. Will send additional refills after this appt.  Knox Saliva, PharmD, MPH, BCPS, CPP Clinical Pharmacist (Rheumatology and Pulmonology)

## 2021-10-28 ENCOUNTER — Ambulatory Visit (INDEPENDENT_AMBULATORY_CARE_PROVIDER_SITE_OTHER): Payer: 59

## 2021-10-28 ENCOUNTER — Ambulatory Visit: Payer: 59 | Admitting: Internal Medicine

## 2021-10-28 VITALS — BP 102/68 | HR 73 | Temp 97.8°F | Resp 18 | Ht 62.0 in | Wt 105.0 lb

## 2021-10-28 DIAGNOSIS — J4489 Other specified chronic obstructive pulmonary disease: Secondary | ICD-10-CM

## 2021-10-28 MED ORDER — OMALIZUMAB 75 MG/0.5ML ~~LOC~~ SOSY
375.0000 mg | PREFILLED_SYRINGE | Freq: Once | SUBCUTANEOUS | Status: AC
  Administered 2021-10-28: 375 mg via SUBCUTANEOUS
  Filled 2021-10-28: qty 0.5

## 2021-10-28 NOTE — Progress Notes (Signed)
Diagnosis: Asthma  Provider:  Marshell Garfinkel MD  Procedure: Injection  Xolair (Omalizumab), Dose: 375 mg, Site: subcutaneous, Number of injections: 3  Post Care:  N/A  Discharge: Condition: Good, Destination: Home . AVS provided to patient.   Performed by:  Cleophus Molt, RN

## 2021-10-31 ENCOUNTER — Other Ambulatory Visit: Payer: Self-pay | Admitting: Internal Medicine

## 2021-11-04 ENCOUNTER — Ambulatory Visit: Payer: 59 | Admitting: Internal Medicine

## 2021-11-18 NOTE — Progress Notes (Unsigned)
Subjective:    Patient ID: Sara Trevino, female    DOB: 19-May-1965, 56 y.o.   MRN: [id number]  HPI  56 year old female never smoker followed for COPD mixed type/chronic cough, allergic rhinitis, nasal polyps Allergy Vaccine-Dr. Donneta Romberg x 3 years w/o clear benefit CT chest 11/2013 clear lungs  CT sinus 12/2013 neg for acute process  Allergy profile 07/21/14- Total IgE 249  Elevated for dust mite. Grass pollens, tree pollens CBC w diff 07/21/14- EOS  13.6 H Allergy skin test 04/21/11 elevated dog, weed pollens, tree pollens molds  She complains primarily of coughing 5 years. Occasional white or yellow sputum. 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 Does not recognize association between symptoms and workplace Had seen Dr. Fredrik Rigger for second opinion and he also thought [place] might be appropriate She was not interested in [place] because it is "too new". PFT 02/23/14 moderate obstructive airways disease, significant response to bronchodilator, air trapping, normal diffusion, normal TLC. FEV1/FVC 0.68,  She was seen at [facility] by ENT/ Dr Senior who identified nasal polyps and has scheduled a CT of sinuses and recommended bilateral endoscopic sinus surgery Quantiferon Gold TB assay Positive 2018  PFT 11/02/16-moderately severe obstructive airways disease with insignificant response to bronchodilator. Diffusion slightly reduced ----------------------------------------------------------------------------------------------r.  10/28/20- 56 year old female never smoker( + second-hand) followed for / AsthmaCOPD mixed type/ Bronchiectasis, chronic cough, allergic rhinitis, nasal polyps, + Quantiferon Gold/ culture Neg Elevated IgE and Eosinophils, Started Xolair 12/05/19  - Proair hfa, Symbicort 160, Xolair, Covid vax-3 Moderna Flu vax- Credits Xolair for having much less cough in the past year.  Continues Symbicort.   Asks about increasing Xolair interval to monthly.  She can discuss this with the infusion clinic.  11/21/21- 56 year old female never smoker( + second-hand) followed for / AsthmaCOPD mixed type/ Bronchiectasis, chronic cough, allergic rhinitis, nasal polyps, + Quantiferon Gold/ culture Neg Elevated IgE and Eosinophils, Started Xolair 12/05/19  - Proair hfa, Symbicort 160, Xolair, Covid vax-3 Moderna Flu vax-declines ------         Pt states Xolair injections help with coughing and helping her feel better She still has dry cough and asks about updating CT chest for status of bronchiectasis.. Overall doing better. She gets 3 Xolair injections and her arms are getting tired of it. She would like to try reducing the dose from 225 mg to 150 mg. We can try it.  Inhalers do help. Rescue not needed every day. CT chest 11/08/20-  IMPRESSION: 1. Stable exam. No new or progressive interval findings. 2. Subtle changes of cylindrical bronchiectasis with associated mild bronchial wall thickening and areas of peripheral small airway impaction, similar to prior.   ROS-see HPI  + = positive Constitutional:    weight loss, night sweats, fevers, chills, fatigue, lassitude. HEENT:    headaches, difficulty swallowing, tooth/dental problems, +sore throat,       +sneezing, +itching, ear ache, +nasal congestion, post nasal drip, snoring CV:    chest pain, orthopnea, PND, swelling in lower extremities, anasarca,                                                     dizziness, palpitations Resp:   +shortness of breath with exertion or at rest.  productive cough,   +non-productive cough, coughing up of blood.              change in color of mucus.   wheezing.   Skin:    rash or lesions. GI:  No-   heartburn, indigestion, abdominal pain, nausea, vomiting, diarrhea,                 change in bowel habits, loss of appetite GU: dysuria, change in color of urine, no urgency or frequency.   flank pain. MS:    joint pain, +stiffness, decreased range of motion, back pain. Neuro-     nothing unusual Psych:  change in mood or affect.  depression or anxiety.   memory loss.    Objective:   Physical Exam OBJ- Physical Exam General- Alert, Oriented, Affect-appropriate, Distress- none acute, + thin Skin- rash-none, lesions- none, excoriation- none Lymphadenopathy- none Head- atraumatic            Eyes- Gross vision intact, PERRLA, conjunctivae and secretions clear            Ears- Hearing, canals-normal            Nose- + Mild turbinate edema, no-Septal dev, mucus, polyps, erosion, perforation             Throat- Mallampati II , mucosa clear , drainage- none, tonsils- atrophic Neck- flexible , trachea midline, no stridor , thyroid nl, carotid no bruit Chest - symmetrical excursion , unlabored           Heart/CV- RRR , no murmur , no gallop  , no rub, nl s1 s2                           - JVD- none , edema- none, stasis changes- none, varices- none           Lung-  Cough+light,  rhonchi- none, wheeze-none , dullness-none, rub- none           Chest wall-  Abd-  Br/ Gen/ Rectal- Not done, not indicated Extrem- cyanosis- none, clubbing, none, atrophy- none, strength- nl Neuro- grossly intact to observation     Assessment & Plan:

## 2021-11-21 ENCOUNTER — Encounter: Payer: Self-pay | Admitting: Internal Medicine

## 2021-11-21 ENCOUNTER — Ambulatory Visit (INDEPENDENT_AMBULATORY_CARE_PROVIDER_SITE_OTHER): Payer: 59 | Admitting: Internal Medicine

## 2021-11-21 ENCOUNTER — Other Ambulatory Visit: Payer: Self-pay | Admitting: Pharmacist

## 2021-11-21 VITALS — BP 124/68 | HR 78 | Ht 62.0 in | Wt 105.2 lb

## 2021-11-21 DIAGNOSIS — J4489 Other specified chronic obstructive pulmonary disease: Secondary | ICD-10-CM | POA: Diagnosis not present

## 2021-11-21 DIAGNOSIS — J479 Bronchiectasis, uncomplicated: Secondary | ICD-10-CM

## 2021-11-21 DIAGNOSIS — J4550 Severe persistent asthma, uncomplicated: Secondary | ICD-10-CM

## 2021-11-21 MED ORDER — OMALIZUMAB 150 MG/ML ~~LOC~~ SOSY: 300.0000 mg | PREFILLED_SYRINGE | SUBCUTANEOUS | 5 refills | Status: DC

## 2021-11-21 MED ORDER — BUDESONIDE-FORMOTEROL FUMARATE 160-4.5 MCG/ACT IN AERO: INHALATION_SPRAY | RESPIRATORY_TRACT | 3 refills | Status: DC

## 2021-11-21 NOTE — Assessment & Plan Note (Signed)
She has felt Xolair helps. Plan- reduce dose from 225 mg to 150 mg- 1 less injection each time, to reduce sore arms from multiple injections.

## 2021-11-21 NOTE — Patient Instructions (Addendum)
Order- pharmacy- please reduce Xolair injection to 150 mg. The third 75 mg injection is too much for her shoulder.  Order- schedule CT chest no contrast dx bronchiectasis  Symbicort refilled through mail order pharmacy

## 2021-11-21 NOTE — Assessment & Plan Note (Signed)
Chronic cough reflects her bronchiectasis Plan- update CT, watching for progression c/w atypical infection or microaspiration

## 2021-11-21 NOTE — Progress Notes (Signed)
Received notification from Dr. Annamaria Boots that patient would like to reduce number of Xolair injections she receives.  New dose: 300mg  SQ every 28 days  Rx sent to [facility] for new dose.  Infusion center orders addended to reflect this  Knox Saliva, PharmD, MPH, BCPS, CPP Clinical Pharmacist (Rheumatology and Pulmonology)

## 2021-11-22 NOTE — Telephone Encounter (Signed)
Refills already sent to [facility] yesterday. Patient is reducing Xolair dose to 300mg  SQ every 28 days  Knox Saliva, PharmD, MPH, BCPS, CPP Clinical Pharmacist (Rheumatology and Pulmonology)

## 2021-11-22 NOTE — Progress Notes (Signed)
Received fax from [facility] requesting clarification on dose. Spoke with Sunny, Rph and advised that patient is switching to 300mg  SQ every 4 weeks  Knox Saliva, PharmD, MPH, BCPS, CPP Clinical Pharmacist (Rheumatology and Pulmonology)

## 2021-11-29 ENCOUNTER — Ambulatory Visit (HOSPITAL_BASED_OUTPATIENT_CLINIC_OR_DEPARTMENT_OTHER)
Admission: RE | Admit: 2021-11-29 | Discharge: 2021-11-29 | Disposition: A | Discharge status: Home / Self Care | Payer: 59 | Source: Ambulatory Visit | Attending: Internal Medicine | Admitting: Internal Medicine

## 2021-11-29 DIAGNOSIS — J479 Bronchiectasis, uncomplicated: Secondary | ICD-10-CM | POA: Diagnosis present

## 2021-12-01 ENCOUNTER — Ambulatory Visit (INDEPENDENT_AMBULATORY_CARE_PROVIDER_SITE_OTHER): Payer: 59

## 2021-12-01 VITALS — BP 108/64 | HR 71 | Temp 98.3°F | Resp 16 | Ht 62.0 in | Wt 105.0 lb

## 2021-12-01 DIAGNOSIS — J4489 Other specified chronic obstructive pulmonary disease: Secondary | ICD-10-CM | POA: Diagnosis not present

## 2021-12-01 MED ORDER — OMALIZUMAB 150 MG/ML ~~LOC~~ SOSY
300.0000 mg | PREFILLED_SYRINGE | Freq: Once | SUBCUTANEOUS | Status: AC
  Administered 2021-12-01: 300 mg via SUBCUTANEOUS
  Filled 2021-12-01: qty 2

## 2021-12-01 NOTE — Progress Notes (Signed)
Diagnosis: Asthma  Provider:  Chilton Greathouse MD  Procedure: Injection  Xolair (Omalizumab), Dose: 375 mg, Site: subcutaneous, Number of injections: 2  Post Care: Patient declined observation  Discharge: Condition: Good, Destination: Home . AVS provided to patient.   Performed by:  Loney Hering, LPN

## 2021-12-02 ENCOUNTER — Ambulatory Visit: Payer: 59

## 2021-12-28 ENCOUNTER — Other Ambulatory Visit: Payer: Self-pay | Admitting: Family Medicine

## 2021-12-28 ENCOUNTER — Other Ambulatory Visit: Payer: Self-pay | Admitting: Internal Medicine

## 2021-12-28 DIAGNOSIS — R9389 Abnormal findings on diagnostic imaging of other specified body structures: Secondary | ICD-10-CM

## 2021-12-28 NOTE — Progress Notes (Signed)
Entered in error

## 2021-12-30 ENCOUNTER — Ambulatory Visit (INDEPENDENT_AMBULATORY_CARE_PROVIDER_SITE_OTHER): Payer: 59 | Admitting: *Deleted

## 2021-12-30 VITALS — BP 101/65 | HR 73 | Temp 97.7°F | Resp 16 | Ht 62.0 in | Wt 105.4 lb

## 2021-12-30 DIAGNOSIS — J4489 Other specified chronic obstructive pulmonary disease: Secondary | ICD-10-CM | POA: Diagnosis not present

## 2021-12-30 MED ORDER — OMALIZUMAB 150 MG/ML ~~LOC~~ SOSY
300.0000 mg | PREFILLED_SYRINGE | Freq: Once | SUBCUTANEOUS | Status: AC
  Administered 2021-12-30: 300 mg via SUBCUTANEOUS
  Filled 2021-12-30: qty 2

## 2021-12-30 NOTE — Progress Notes (Signed)
Diagnosis: Asthma  Provider:  Praveen Mannam MD  Procedure: Injection  Xolair (Omalizumab), Dose: 300 mg, Site: subcutaneous, Number of injections: 2  Post Care: Observation period completed  Discharge: Condition: Good, Destination: Home . AVS provided to patient.   Performed by:  Channon Brougher A, RN       

## 2022-01-04 ENCOUNTER — Other Ambulatory Visit: Payer: Self-pay | Admitting: Internal Medicine

## 2022-01-04 DIAGNOSIS — R9389 Abnormal findings on diagnostic imaging of other specified body structures: Secondary | ICD-10-CM

## 2022-01-27 ENCOUNTER — Ambulatory Visit (INDEPENDENT_AMBULATORY_CARE_PROVIDER_SITE_OTHER): Payer: 59 | Admitting: *Deleted

## 2022-01-27 VITALS — BP 115/73 | HR 73 | Temp 98.0°F | Resp 16 | Ht 62.0 in | Wt 107.2 lb

## 2022-01-27 DIAGNOSIS — J4489 Other specified chronic obstructive pulmonary disease: Secondary | ICD-10-CM | POA: Diagnosis not present

## 2022-01-27 MED ORDER — OMALIZUMAB 150 MG/ML ~~LOC~~ SOSY
300.0000 mg | PREFILLED_SYRINGE | Freq: Once | SUBCUTANEOUS | Status: AC
  Administered 2022-01-27: 300 mg via SUBCUTANEOUS
  Filled 2022-01-27: qty 2

## 2022-01-27 NOTE — Progress Notes (Signed)
Diagnosis: Asthma  Provider:  Praveen Mannam MD  Procedure: Injection  Xolair (Omalizumab), Dose: 300 mg, Site: subcutaneous, Number of injections: 2  Post Care: Observation period completed  Discharge: Condition: Good, Destination: Home . AVS provided to patient.   Performed by:  Juventino Pavone A, RN       

## 2022-02-02 ENCOUNTER — Ambulatory Visit
Admission: RE | Admit: 2022-02-02 | Discharge: 2022-02-02 | Disposition: A | Discharge status: Home / Self Care | Payer: 59 | Source: Ambulatory Visit | Attending: Internal Medicine | Admitting: Internal Medicine

## 2022-02-02 DIAGNOSIS — R9389 Abnormal findings on diagnostic imaging of other specified body structures: Secondary | ICD-10-CM

## 2022-02-24 ENCOUNTER — Ambulatory Visit (INDEPENDENT_AMBULATORY_CARE_PROVIDER_SITE_OTHER): Payer: 59

## 2022-02-24 VITALS — BP 109/69 | HR 68 | Temp 98.2°F | Resp 16 | Ht 62.0 in | Wt 102.0 lb

## 2022-02-24 DIAGNOSIS — J4489 Other specified chronic obstructive pulmonary disease: Secondary | ICD-10-CM | POA: Diagnosis not present

## 2022-02-24 MED ORDER — OMALIZUMAB 150 MG/ML ~~LOC~~ SOSY
300.0000 mg | PREFILLED_SYRINGE | Freq: Once | SUBCUTANEOUS | Status: AC
  Administered 2022-02-24: 300 mg via SUBCUTANEOUS
  Filled 2022-02-24: qty 2

## 2022-02-24 NOTE — Progress Notes (Signed)
Diagnosis: Asthma  Provider:  Marshell Garfinkel MD  Procedure: Injection  Xolair (Omalizumab), Dose: 300 mg, Site: subcutaneous, Number of injections: 2  Post Care:  n/a  Discharge: Condition: Good, Destination: Home . AVS Declined  Performed by:  Adelina Mings, LPN

## 2022-03-20 ENCOUNTER — Telehealth: Payer: Self-pay

## 2022-03-20 NOTE — Telephone Encounter (Signed)
Received faxed PA renewal from from [facility]. Completed and faxed back along with supporting chart notes. Will await determination.  Fax# [phone] Phone# [phone]

## 2022-03-21 NOTE — Telephone Encounter (Signed)
Received fax stating PA went thru to [facility] incomplete. Refaxed Xolair PA form.  Knox Saliva, PharmD, MPH, BCPS, CPP Clinical Pharmacist (Rheumatology and Pulmonology)

## 2022-03-23 NOTE — Telephone Encounter (Signed)
Received notification from [facility] regarding a prior authorization for XOLAIR. Authorization has been APPROVED from 03/21/2022 to 03/21/2023. Approval letter sent to scan center.  Patient must continue to fill through [facility]: [phone]  Authorization # [id number]  Knox Saliva, PharmD, MPH, BCPS, CPP Clinical Pharmacist (Rheumatology and Pulmonology)

## 2022-03-24 ENCOUNTER — Ambulatory Visit (INDEPENDENT_AMBULATORY_CARE_PROVIDER_SITE_OTHER): Payer: 59

## 2022-03-24 VITALS — BP 121/74 | HR 70 | Temp 98.0°F | Resp 18 | Ht 62.0 in | Wt 104.4 lb

## 2022-03-24 DIAGNOSIS — J4489 Other specified chronic obstructive pulmonary disease: Secondary | ICD-10-CM

## 2022-03-24 MED ORDER — OMALIZUMAB 150 MG/ML ~~LOC~~ SOSY
300.0000 mg | PREFILLED_SYRINGE | Freq: Once | SUBCUTANEOUS | Status: AC
  Administered 2022-03-24: 300 mg via SUBCUTANEOUS
  Filled 2022-03-24: qty 2

## 2022-03-24 NOTE — Progress Notes (Signed)
Diagnosis: Asthma  Provider:  Marshell Garfinkel MD  Procedure: Injection  Xolair (Omalizumab), Dose: 300 mg, Site: subcutaneous, Number of injections: 2  Administered in abdomen.  Post Care: Patient declined observation  Discharge: Condition: Stable, Destination: Home . AVS Declined  Performed by:  Binnie Kand, RN

## 2022-04-12 ENCOUNTER — Other Ambulatory Visit: Payer: Self-pay

## 2022-04-12 ENCOUNTER — Telehealth: Payer: Self-pay | Admitting: Internal Medicine

## 2022-04-12 MED ORDER — BUDESONIDE-FORMOTEROL FUMARATE 160-4.5 MCG/ACT IN AERO: INHALATION_SPRAY | RESPIRATORY_TRACT | 6 refills | Status: DC

## 2022-04-12 NOTE — Telephone Encounter (Signed)
Pt. Needs refill on budesonide-formoterol (SYMBICORT)  and calling to ask if dr. Annamaria Boots can send in for her

## 2022-04-12 NOTE — Telephone Encounter (Signed)
Refill of symbicort has been sent to patients pharmacy. She is aware. NFN

## 2022-04-14 ENCOUNTER — Ambulatory Visit: Payer: 59

## 2022-04-19 LAB — LAB REPORT - SCANNED: EGFR: 87

## 2022-04-20 ENCOUNTER — Other Ambulatory Visit (HOSPITAL_COMMUNITY): Payer: Self-pay

## 2022-04-20 ENCOUNTER — Telehealth: Payer: Self-pay

## 2022-04-20 ENCOUNTER — Encounter: Payer: Self-pay | Admitting: Internal Medicine

## 2022-04-20 NOTE — Telephone Encounter (Signed)
Preferred alternatives are generic Advair Diskus or Breo, please advise if these have been failed or send in a new prescription if appropriate.

## 2022-04-20 NOTE — Telephone Encounter (Signed)
Her insurance will no longer cover Symbicort. Please let her know and send Rx for     Breo 100, # 1, ref x 12   Inhale 1 puff then rinse mouth, once daily

## 2022-04-20 NOTE — Telephone Encounter (Signed)
PA request received via CMM for Budesonide-Formoterol Fumarate 160-4.5MCG/ACT aerosol  Preferred medications are generic Advair Diskus or Breo Ellipta.  Key: [id number]

## 2022-04-21 ENCOUNTER — Other Ambulatory Visit: Payer: Self-pay | Admitting: Internal Medicine

## 2022-04-21 ENCOUNTER — Ambulatory Visit (INDEPENDENT_AMBULATORY_CARE_PROVIDER_SITE_OTHER): Payer: 59 | Admitting: *Deleted

## 2022-04-21 VITALS — BP 111/73 | HR 73 | Temp 97.5°F | Resp 18 | Ht 62.0 in | Wt 105.4 lb

## 2022-04-21 DIAGNOSIS — J4489 Other specified chronic obstructive pulmonary disease: Secondary | ICD-10-CM | POA: Diagnosis not present

## 2022-04-21 MED ORDER — OMALIZUMAB 150 MG/ML ~~LOC~~ SOSY
300.0000 mg | PREFILLED_SYRINGE | Freq: Once | SUBCUTANEOUS | Status: AC
  Administered 2022-04-21: 300 mg via SUBCUTANEOUS
  Filled 2022-04-21: qty 2

## 2022-04-21 NOTE — Progress Notes (Signed)
Diagnosis: Asthma  Provider:  Praveen Mannam MD  Procedure: Injection  Xolair (Omalizumab), Dose: 300 mg, Site: subcutaneous, Number of injections: 2  Post Care: Observation period completed  Discharge: Condition: Good, Destination: Home . AVS Provided and AVS Declined  Performed by:  Broady Lafoy A, RN       

## 2022-04-24 ENCOUNTER — Telehealth: Payer: Self-pay | Admitting: Internal Medicine

## 2022-04-24 NOTE — Telephone Encounter (Signed)
OK refill request for Symbicort 160 mcg for 90 days supply with one refill.  Per chart note: Return in about 1 year (around 11/22/2022).  Symbicort refilled through mail order pharmacy

## 2022-04-24 NOTE — Telephone Encounter (Signed)
Nellie states patient's Symbicort inhaler needs to be prior authorization. Prior authorization [employer] phone number is [phone]. Nellie phone number is [phone].

## 2022-04-26 NOTE — Telephone Encounter (Signed)
Reviewed previous note, patient was never called about Breo. I called the patient but she did not answer. Left message for her to call us back.

## 2022-04-26 NOTE — Telephone Encounter (Signed)
Called and spoke with patient about the [place]. She stated that she has tried and failed Breo before in the past. I searched her chart and she has tried both Breo and Advair in the past.   Can we submit this information to the insurance since she has tried their covered medications?

## 2022-04-26 NOTE — Telephone Encounter (Signed)
Per previous note medication has been changed to [facility] from Symbicort due to non-coverage.

## 2022-04-27 ENCOUNTER — Telehealth: Payer: Self-pay

## 2022-04-27 NOTE — Telephone Encounter (Signed)
Thank you for the additional information! I will work on submitting and will create a new encounter.

## 2022-04-27 NOTE — Telephone Encounter (Signed)
PA request received via CMM for Symbicort 160-4.5MCG/ACT aerosol  PA has been submitted and is pending additional questions/determination  Key: [id number]  *patient previously tried and failed Breo and Advair Diskus

## 2022-05-01 NOTE — Telephone Encounter (Signed)
PA has been APPROVED from 04/27/2022-04/26/2023

## 2022-05-11 ENCOUNTER — Other Ambulatory Visit: Payer: Self-pay | Admitting: Internal Medicine

## 2022-05-11 ENCOUNTER — Ambulatory Visit
Admission: RE | Admit: 2022-05-11 | Discharge: 2022-05-11 | Disposition: A | Discharge status: Home / Self Care | Payer: 59 | Source: Ambulatory Visit | Attending: Internal Medicine | Admitting: Internal Medicine

## 2022-05-11 DIAGNOSIS — R042 Hemoptysis: Secondary | ICD-10-CM

## 2022-05-19 ENCOUNTER — Ambulatory Visit (INDEPENDENT_AMBULATORY_CARE_PROVIDER_SITE_OTHER): Payer: 59

## 2022-05-19 VITALS — BP 94/59 | HR 73 | Temp 97.8°F | Resp 20 | Ht 62.0 in | Wt 103.4 lb

## 2022-05-19 DIAGNOSIS — J4489 Other specified chronic obstructive pulmonary disease: Secondary | ICD-10-CM | POA: Diagnosis not present

## 2022-05-19 MED ORDER — OMALIZUMAB 150 MG/ML ~~LOC~~ SOSY
300.0000 mg | PREFILLED_SYRINGE | Freq: Once | SUBCUTANEOUS | Status: AC
  Administered 2022-05-19: 300 mg via SUBCUTANEOUS
  Filled 2022-05-19: qty 2

## 2022-05-19 NOTE — Progress Notes (Signed)
Diagnosis: Asthma  Provider:  Chilton Greathouse MD  Procedure: Injection  Xolair (Omalizumab), Dose: 300 mg, Site: subcutaneous, Number of injections: 2  Administered injections in abdomen.  Post Care: Patient declined observation  Discharge: Condition: Good, Destination: Home . AVS Declined  Performed by:  Wyvonne Lenz, RN

## 2022-05-22 ENCOUNTER — Other Ambulatory Visit: Payer: Self-pay | Admitting: Internal Medicine

## 2022-05-22 DIAGNOSIS — J4550 Severe persistent asthma, uncomplicated: Secondary | ICD-10-CM

## 2022-05-23 NOTE — Telephone Encounter (Signed)
Refill sent for [facility] to [facility]: [phone]  Dose: 300 mg  SQ every 4 weeks  Last OV: 11/21/2021 Provider: Dr. Maple Hudson  Next OV: 12/01/22  Chesley Mires, PharmD, MPH, BCPS Clinical Pharmacist (Rheumatology and Pulmonology)

## 2022-05-25 ENCOUNTER — Ambulatory Visit
Admission: RE | Admit: 2022-05-25 | Discharge: 2022-05-25 | Disposition: A | Discharge status: Home / Self Care | Payer: 59 | Source: Ambulatory Visit | Attending: Obstetrics and Gynecology | Admitting: Obstetrics and Gynecology

## 2022-05-25 ENCOUNTER — Ambulatory Visit: Payer: 59

## 2022-05-25 DIAGNOSIS — Z1231 Encounter for screening mammogram for malignant neoplasm of breast: Secondary | ICD-10-CM

## 2022-06-16 ENCOUNTER — Ambulatory Visit (INDEPENDENT_AMBULATORY_CARE_PROVIDER_SITE_OTHER): Payer: 59

## 2022-06-16 VITALS — BP 99/64 | HR 73 | Temp 97.8°F | Resp 16 | Ht 62.0 in | Wt 103.2 lb

## 2022-06-16 DIAGNOSIS — J4489 Other specified chronic obstructive pulmonary disease: Secondary | ICD-10-CM | POA: Diagnosis not present

## 2022-06-16 MED ORDER — OMALIZUMAB 150 MG/ML ~~LOC~~ SOSY
300.0000 mg | PREFILLED_SYRINGE | Freq: Once | SUBCUTANEOUS | Status: AC
  Administered 2022-06-16: 300 mg via SUBCUTANEOUS
  Filled 2022-06-16: qty 2

## 2022-06-16 NOTE — Progress Notes (Signed)
Diagnosis: Asthma  Provider:  Mannam, Praveen MD  Procedure: Injection  Xolair (Omalizumab), Dose: 300 mg, Site: subcutaneous, Number of injections: 2  Post Care:  n/a  Discharge: Condition: Good, Destination: Home . AVS Declined  Performed by:  Jarick Harkins E Kamarion Zagami, LPN       

## 2022-07-14 ENCOUNTER — Ambulatory Visit (INDEPENDENT_AMBULATORY_CARE_PROVIDER_SITE_OTHER): Payer: 59

## 2022-07-14 VITALS — BP 121/72 | HR 78 | Temp 97.8°F | Resp 18 | Ht 62.0 in | Wt 103.8 lb

## 2022-07-14 DIAGNOSIS — J4489 Other specified chronic obstructive pulmonary disease: Secondary | ICD-10-CM | POA: Diagnosis not present

## 2022-07-14 MED ORDER — OMALIZUMAB 150 MG/ML ~~LOC~~ SOSY
300.0000 mg | PREFILLED_SYRINGE | Freq: Once | SUBCUTANEOUS | Status: AC
  Administered 2022-07-14: 300 mg via SUBCUTANEOUS
  Filled 2022-07-14: qty 2

## 2022-07-14 NOTE — Progress Notes (Signed)
Diagnosis: Asthma  Provider:  Chilton Greathouse MD  Procedure: Injection  Xolair (Omalizumab), Dose: 300 mg, Site: subcutaneous, Number of injections: 2  Post Care:     Discharge: Condition: Good, Destination: Home . AVS Declined  Performed by:  Garnette Czech, RN

## 2022-08-11 ENCOUNTER — Ambulatory Visit (INDEPENDENT_AMBULATORY_CARE_PROVIDER_SITE_OTHER): Payer: 59

## 2022-08-11 VITALS — BP 103/67 | HR 68 | Temp 98.1°F | Resp 16 | Ht 62.0 in | Wt 106.0 lb

## 2022-08-11 DIAGNOSIS — J4489 Other specified chronic obstructive pulmonary disease: Secondary | ICD-10-CM | POA: Diagnosis not present

## 2022-08-11 MED ORDER — OMALIZUMAB 150 MG/ML ~~LOC~~ SOSY
300.0000 mg | PREFILLED_SYRINGE | Freq: Once | SUBCUTANEOUS | Status: AC
  Administered 2022-08-11: 300 mg via SUBCUTANEOUS
  Filled 2022-08-11: qty 2

## 2022-08-11 NOTE — Progress Notes (Signed)
Diagnosis: Asthma  Provider:  Chilton Greathouse MD  Procedure: Injection  Xolair (Omalizumab), Dose: 300 mg, Site: subcutaneous, Number of injections: 2  Post Care:  No observation period. 1 injection in right arm and 1 injection in left arm  Discharge: Condition: Good, Destination: Home . AVS Declined  Performed by:  Rico Ala, LPN

## 2022-08-13 IMAGING — MG MM DIGITAL SCREENING BILAT W/ TOMO AND CAD
6 of 10 series · 6 of 30 positions shown · non-contrast
Comparison: Previous exam(s).

CLINICAL DATA: Screening.

EXAM:
DIGITAL SCREENING BILATERAL MAMMOGRAM WITH TOMOSYNTHESIS AND CAD
TECHNIQUE: Bilateral screening digital craniocaudal and mediolateral oblique
mammograms were obtained. Bilateral screening digital breast
tomosynthesis was performed. The images were evaluated with
computer-aided detection.

[L MLO synth-2D (1 of 2)]
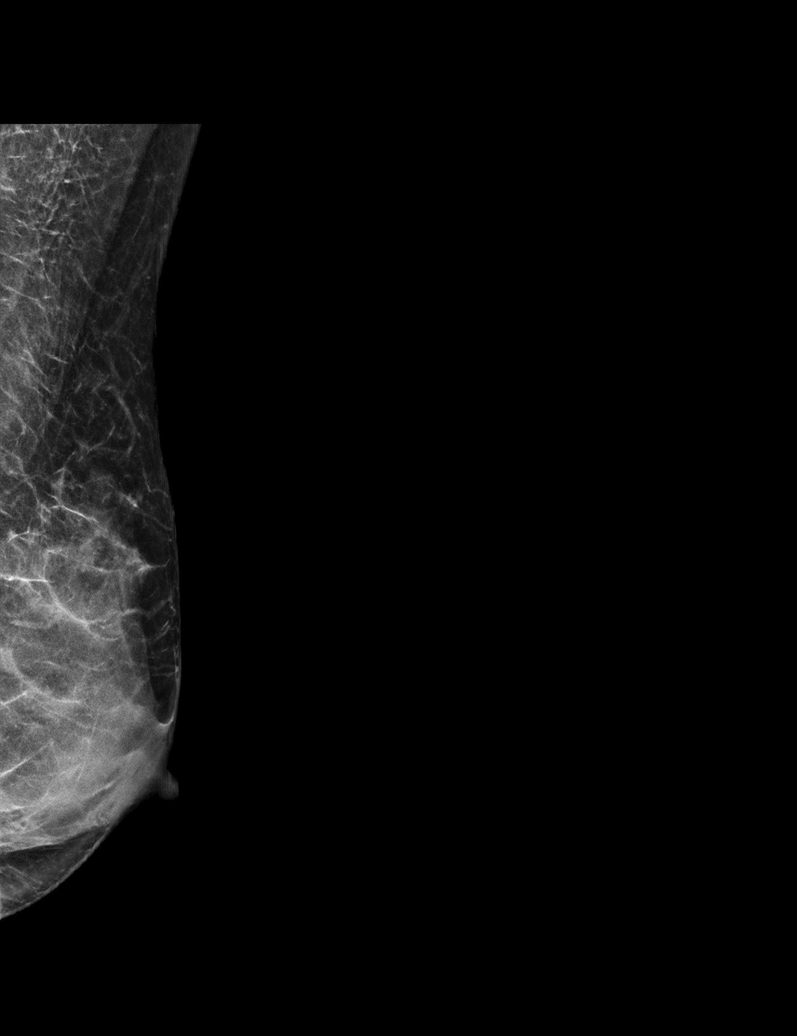

[R MLO synth-2D]
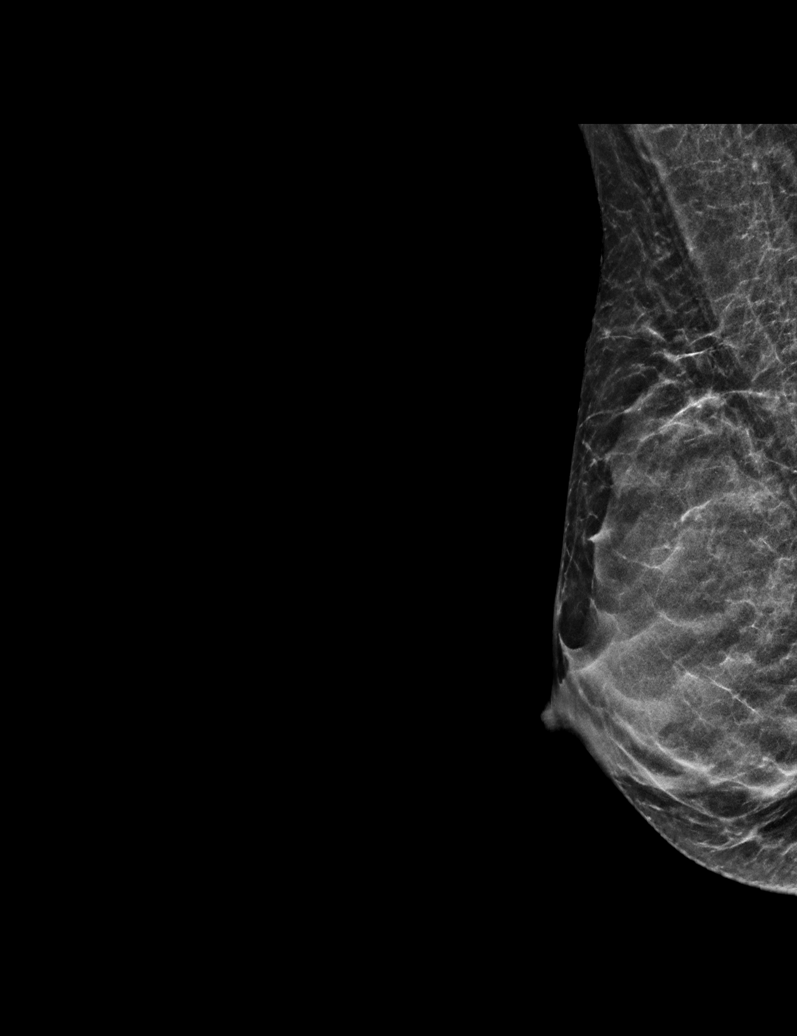

[L MLO synth-2D (2 of 2)]
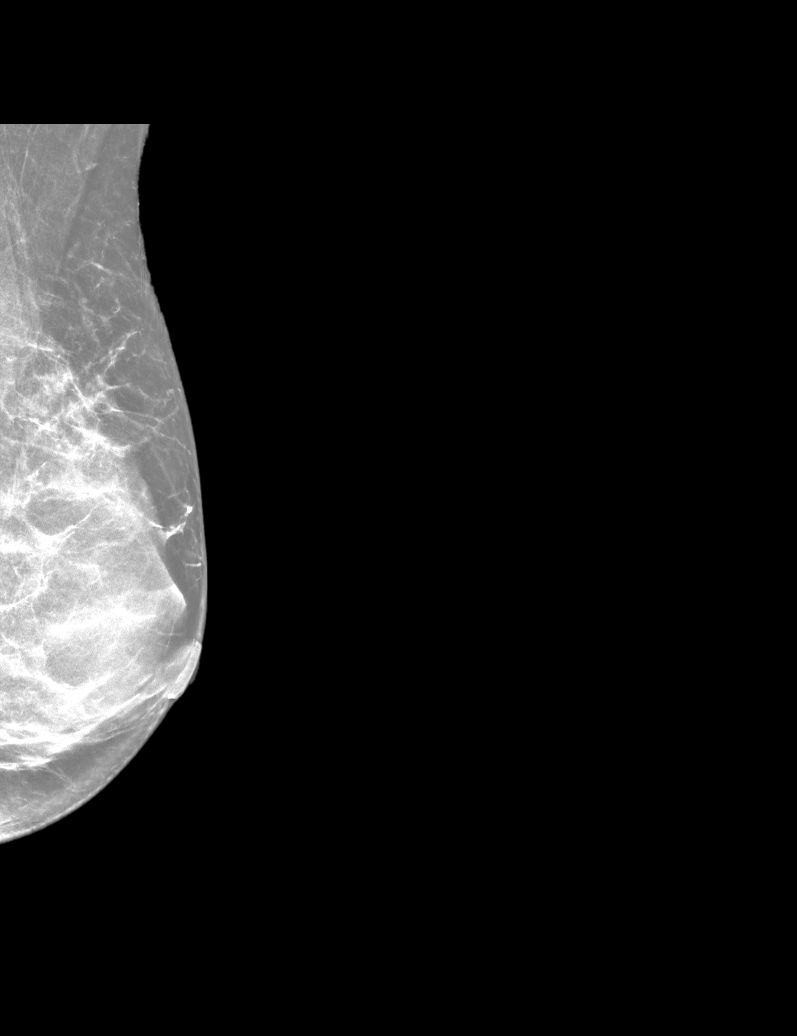

[L CC synth-2D]
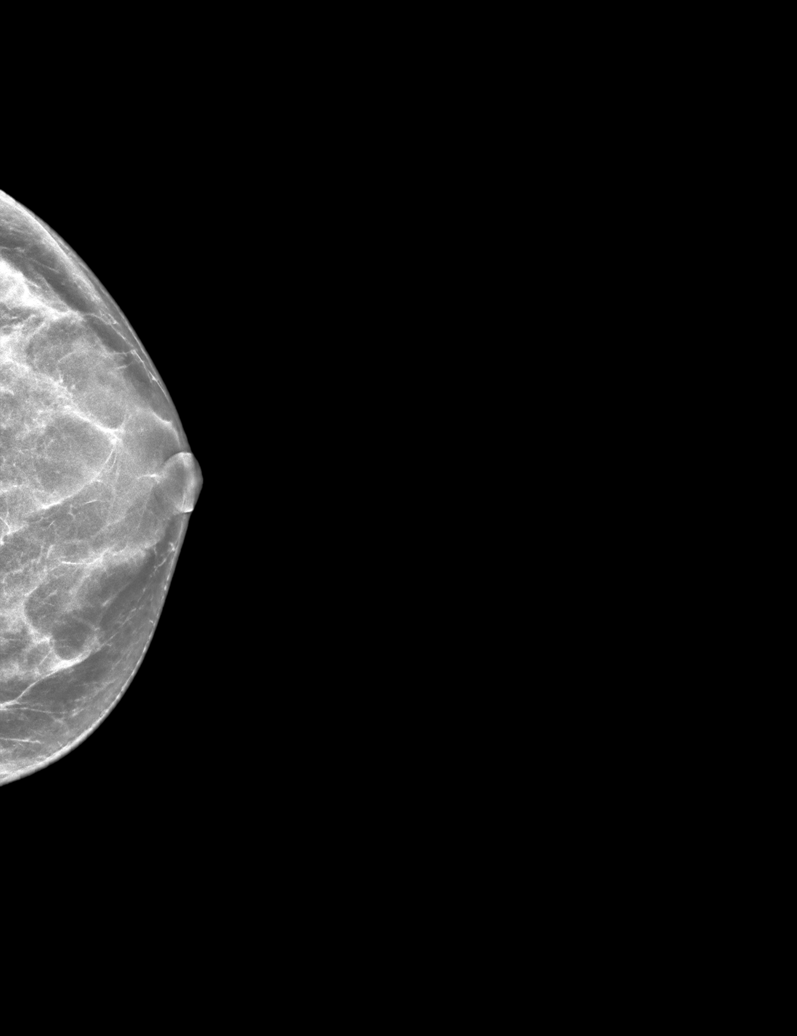

[R CC synth-2D]
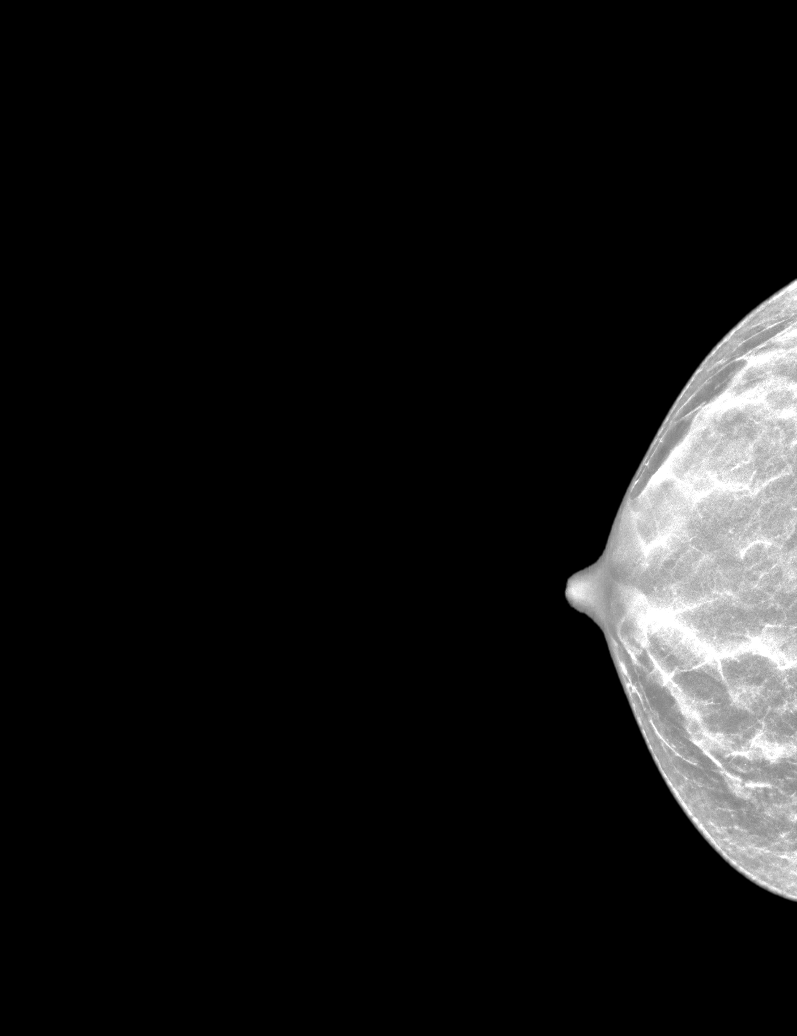

[R CC tomo · tomo slice 21/41.0]
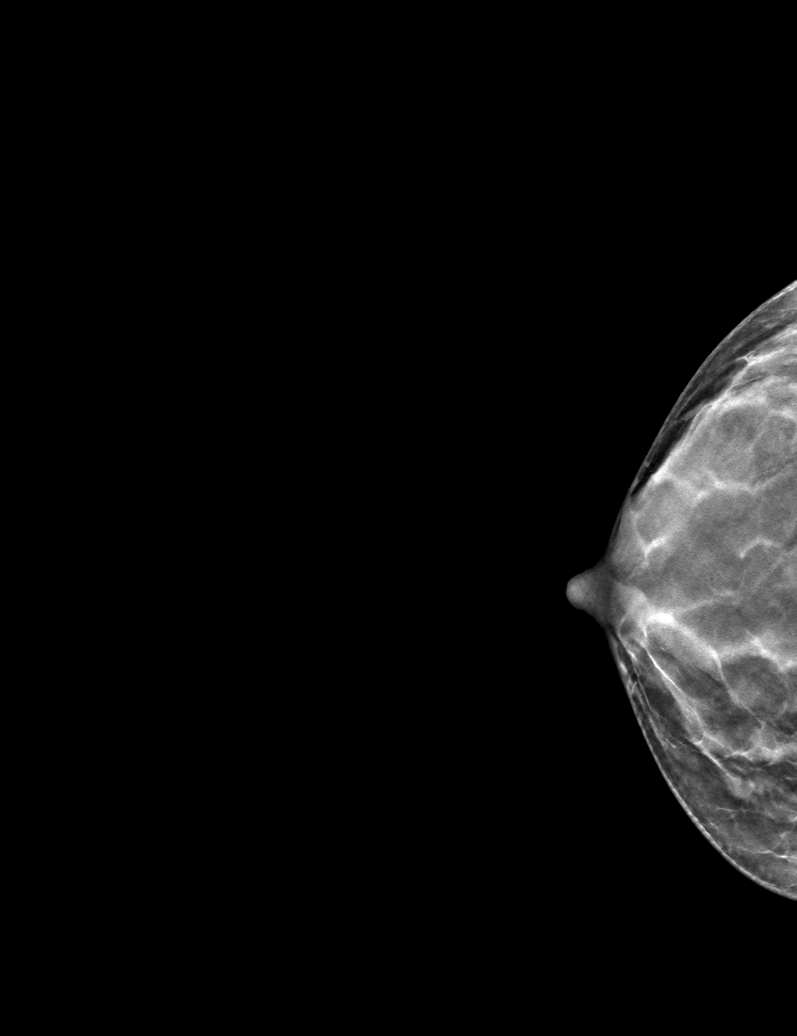

[6 of 30 positions shown; findings below may reference images not displayed]

ACR Breast Density Category d: The breast tissue is extremely dense,
which lowers the sensitivity of mammography
FINDINGS: There are no findings suspicious for malignancy.
IMPRESSION: No mammographic evidence of malignancy. A result letter of this
screening mammogram will be mailed directly to the patient.

RECOMMENDATION:
Screening mammogram in one year. (Code:[id number])

BI-RADS CATEGORY  1: Negative.

## 2022-09-08 ENCOUNTER — Ambulatory Visit (INDEPENDENT_AMBULATORY_CARE_PROVIDER_SITE_OTHER): Payer: 59

## 2022-09-08 VITALS — BP 98/62 | HR 72 | Temp 98.2°F | Resp 16 | Ht 62.0 in | Wt 105.2 lb

## 2022-09-08 DIAGNOSIS — J4489 Other specified chronic obstructive pulmonary disease: Secondary | ICD-10-CM

## 2022-09-08 MED ORDER — OMALIZUMAB 150 MG/ML ~~LOC~~ SOSY
300.0000 mg | PREFILLED_SYRINGE | Freq: Once | SUBCUTANEOUS | Status: AC
  Administered 2022-09-08: 300 mg via SUBCUTANEOUS
  Filled 2022-09-08: qty 2

## 2022-09-08 NOTE — Progress Notes (Signed)
Diagnosis: Asthma  Provider:  Chilton Greathouse MD  Procedure: Injection  Xolair (Omalizumab), Dose: 300 mg, Site: subcutaneous, Number of injections: 2   Discharge: Condition: Good, Destination: Home . AVS Declined  Performed by:  Nat Math, RN

## 2022-10-13 ENCOUNTER — Ambulatory Visit (INDEPENDENT_AMBULATORY_CARE_PROVIDER_SITE_OTHER): Payer: 59

## 2022-10-13 VITALS — BP 110/70 | HR 78 | Temp 97.3°F | Ht 62.0 in | Wt 106.2 lb

## 2022-10-13 DIAGNOSIS — J4489 Other specified chronic obstructive pulmonary disease: Secondary | ICD-10-CM | POA: Diagnosis not present

## 2022-10-13 MED ORDER — OMALIZUMAB 150 MG/ML ~~LOC~~ SOSY
300.0000 mg | PREFILLED_SYRINGE | Freq: Once | SUBCUTANEOUS | Status: AC
  Administered 2022-10-13: 300 mg via SUBCUTANEOUS
  Filled 2022-10-13: qty 2

## 2022-10-13 NOTE — Progress Notes (Signed)
Diagnosis: Asthma  Provider:  Chilton Greathouse MD  Procedure: Injection  Xolair (Omalizumab), Dose: 300 mg, Site: subcutaneous, Number of injections: 2  Post Care: Patient declined observation  Discharge: Condition: Good, Destination: Home . AVS Declined  Performed by:  Loney Hering, LPN

## 2022-11-03 ENCOUNTER — Ambulatory Visit
Admission: RE | Admit: 2022-11-03 | Discharge: 2022-11-03 | Disposition: A | Discharge status: Home / Self Care | Payer: 59 | Source: Ambulatory Visit | Attending: Family Medicine | Admitting: Family Medicine

## 2022-11-03 ENCOUNTER — Other Ambulatory Visit: Payer: Self-pay | Admitting: Family Medicine

## 2022-11-03 DIAGNOSIS — M79674 Pain in right toe(s): Secondary | ICD-10-CM

## 2022-11-03 NOTE — Progress Notes (Signed)
Struck 5th toe on door while ambulating. Bruising and tenderness sincee that time.

## 2022-11-10 ENCOUNTER — Ambulatory Visit (INDEPENDENT_AMBULATORY_CARE_PROVIDER_SITE_OTHER): Payer: 59

## 2022-11-10 VITALS — BP 120/73 | HR 65 | Temp 97.5°F | Resp 16 | Ht 62.0 in | Wt 108.0 lb

## 2022-11-10 DIAGNOSIS — J4489 Other specified chronic obstructive pulmonary disease: Secondary | ICD-10-CM | POA: Diagnosis not present

## 2022-11-10 MED ORDER — OMALIZUMAB 150 MG/ML ~~LOC~~ SOSY
300.0000 mg | PREFILLED_SYRINGE | Freq: Once | SUBCUTANEOUS | Status: AC
  Administered 2022-11-10: 300 mg via SUBCUTANEOUS

## 2022-11-10 NOTE — Progress Notes (Signed)
Diagnosis: Asthma  Provider:  Chilton Greathouse MD  Procedure: Injection  Xolair (Omalizumab), Dose: 300 mg, Site: subcutaneous, Number of injections: 2  Post Care: Patient declined observation  Discharge: Condition: Good, Destination: Home . AVS Provided  Performed by:  Loney Hering, LPN

## 2022-11-30 NOTE — Progress Notes (Signed)
Subjective:    Patient ID: Sara Trevino, female    DOB: 14-Sep-1965, 57 y.o.   MRN: [id number]  HPI  57 year old female never smoker followed for COPD mixed type/chronic cough, allergic rhinitis, nasal polyps Allergy Vaccine-Dr. [place]el [place] Callas x 3 years w/o clear benefit CT chest 11/2013 clear lungs  CT sinus 12/2013 neg for acute process  Allergy profile 07/21/14- Total IgE 249  Elevated for dust mite. Grass pollens, tree pollens CBC w diff 07/21/14- EOS  13.6 H Allergy skin test 04/21/11 elevated dog, weed pollens, tree pollens molds  She complains primarily of coughing 5 years. Occasional white or yellow sputum. 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 Does not recognize association between symptoms and workplace Had seen Dr. Lyda Jester for second opinion and he also thought Nucala might be appropriate She was not interested in [place] because it is "too new". PFT 02/23/14 moderate obstructive airways disease, significant response to bronchodilator, air trapping, normal diffusion, normal TLC. FEV1/FVC 0.68,  She was seen at [facility] by ENT/ Dr Senior who identified nasal polyps and has scheduled a CT of sinuses and recommended bilateral endoscopic sinus surgery Quantiferon Gold TB assay Positive 2018  PFT 11/02/16-moderately severe obstructive airways disease with insignificant response to bronchodilator. Diffusion slightly reduced ----------------------------------------------------------------------------------------------r.  11/21/21- 57 year old female never smoker( + second-hand) followed for / AsthmaCOPD mixed type/ Bronchiectasis, chronic cough, allergic rhinitis, nasal polyps, + Quantiferon Gold/ culture Neg Elevated IgE and Eosinophils, Started Xolair 12/05/19  - Proair hfa, Symbicort 160, Xolair, Covid vax-3 Moderna Flu vax-declines ------         Pt states Xolair injections help with coughing and helping  her feel better She still has dry cough and asks about updating CT chest for status of bronchiectasis.. Overall doing better. She gets 3 Xolair injections and her arms are getting tired of it. She would like to try reducing the dose from 225 mg to 150 mg. We can try it.  Inhalers do help. Rescue not needed every day. CT chest 11/08/20-  IMPRESSION: 1. Stable exam. No new or progressive interval findings. 2. Subtle changes of cylindrical bronchiectasis with associated mild bronchial wall thickening and areas of peripheral small airway impaction, similar to prior.  12/01/22- 57 year old female never smoker( + second-hand) followed for / AsthmaCOPD mixed type/ Bronchiectasis, chronic cough, allergic rhinitis, nasal polyps, + Quantiferon Gold/ AFB culture Neg Elevated IgE and Eosinophils, Started Xolair 12/05/19  - Proair hfa, Symbicort 160, Xolair       Pt states Xolair injections help with coughing and helping her feel better ------Breathing is good  ACT 23  She has been doing quite well with no recent exacerbations.  Some cough.  She brought outside labs which look good and are scanned.  We talked about what kind of follow-up would be appropriate and how long to continue Xolair.  We have decided to try tapering off of Symbicort to see whether she still needs it.  Stopping Xolair for trial could be a next step. Chest x-ray report looks very good.  CT scan might show residual bronchiectasis but we decided to defer that for now. CXR 05/11/22- FINDINGS: The cardiac silhouette mediastinal and hilar contours are within normal limits. The lungs are clear. Mild hyperinflation but no infiltrates, edema or effusions. No pulmonary lesions. Bilateral nipple shadows are noted. The bony thorax is intact. IMPRESSION: Mild hyperinflation but no acute pulmonary findings.   ROS-see HPI  + =  positive Constitutional:    weight loss, night sweats, fevers, chills, fatigue, lassitude. HEENT:    headaches,  difficulty swallowing, tooth/dental problems, +sore throat,       +sneezing, +itching, ear ache, +nasal congestion, post nasal drip, snoring CV:    chest pain, orthopnea, PND, swelling in lower extremities, anasarca,                                                     dizziness, palpitations Resp:   +shortness of breath with exertion or at rest.                productive cough,   +non-productive cough, coughing up of blood.              change in color of mucus.   wheezing.   Skin:    rash or lesions. GI:  No-   heartburn, indigestion, abdominal pain, nausea, vomiting, diarrhea,                 change in bowel habits, loss of appetite GU: dysuria, change in color of urine, no urgency or frequency.   flank pain. MS:   joint pain, +stiffness, decreased range of motion, back pain. Neuro-     nothing unusual Psych:  change in mood or affect.  depression or anxiety.   memory loss.    Objective:   Physical Exam OBJ- Physical Exam General- Alert, Oriented, Affect-appropriate, Distress- none acute, + thin Skin- rash-none, lesions- none, excoriation- none Lymphadenopathy- none Head- atraumatic            Eyes- Gross vision intact, PERRLA, conjunctivae and secretions clear            Ears- Hearing, canals-normal            Nose- + Mild turbinate edema, no-Septal dev, mucus, polyps, erosion, perforation             Throat- Mallampati II , mucosa clear , drainage- none, tonsils- atrophic Neck- flexible , trachea midline, no stridor , thyroid nl, carotid no bruit Chest - symmetrical excursion , unlabored           Heart/CV- RRR , no murmur , no gallop  , no rub, nl s1 s2                           - JVD- none , edema- none, stasis changes- none, varices- none           Lung-  Cough+light,  rhonchi- none, wheeze-none , dullness-none, rub- none           Chest wall-  Abd-  Br/ Gen/ Rectal- Not done, not indicated Extrem- cyanosis- none, clubbing, none, atrophy- none, strength- nl Neuro- grossly  intact to observation     Assessment & Plan:

## 2022-12-01 ENCOUNTER — Encounter: Payer: Self-pay | Admitting: Internal Medicine

## 2022-12-01 ENCOUNTER — Ambulatory Visit: Payer: 59 | Admitting: Internal Medicine

## 2022-12-01 VITALS — BP 105/68 | HR 65 | Ht 62.0 in | Wt 106.8 lb

## 2022-12-01 DIAGNOSIS — J4489 Other specified chronic obstructive pulmonary disease: Secondary | ICD-10-CM

## 2022-12-01 DIAGNOSIS — J479 Bronchiectasis, uncomplicated: Secondary | ICD-10-CM | POA: Diagnosis not present

## 2022-12-01 LAB — CBC WITH DIFFERENTIAL/PLATELET
Basophils Absolute: 0 10*3/uL (ref 0.0–0.1)
Basophils Relative: 0.7 % (ref 0.0–3.0)
Eosinophils Absolute: 0.4 10*3/uL (ref 0.0–0.7)
Eosinophils Relative: 11.8 % — ABNORMAL HIGH (ref 0.0–5.0)
HCT: 40.9 % (ref 36.0–46.0)
Hemoglobin: 13.6 g/dL (ref 12.0–15.0)
Lymphocytes Relative: 26.3 % (ref 12.0–46.0)
Lymphs Abs: 1 10*3/uL (ref 0.7–4.0)
MCHC: 33.2 g/dL (ref 30.0–36.0)
MCV: 96 fL (ref 78.0–100.0)
Monocytes Absolute: 0.3 10*3/uL (ref 0.1–1.0)
Monocytes Relative: 8.8 % (ref 3.0–12.0)
Neutro Abs: 1.9 10*3/uL (ref 1.4–7.7)
Neutrophils Relative %: 52.4 % (ref 43.0–77.0)
Platelets: 190 10*3/uL (ref 150.0–400.0)
RBC: 4.26 Mil/uL (ref 3.87–5.11)
RDW: 14.7 % (ref 11.5–15.5)
WBC: 3.7 10*3/uL — ABNORMAL LOW (ref 4.0–10.5)

## 2022-12-01 NOTE — Assessment & Plan Note (Signed)
Mild to moderate, persistent, uncomplicated. Plan-we will try reducing Symbicort for now, may be come off of Xolair later after 3 years of treatment.

## 2022-12-01 NOTE — Patient Instructions (Signed)
Order- lab- CBC w diff, IgE   dx Bronchiectasis without exacerbation  Try reducing your Symbicort dose to 1 puff, twice daily. If you feel comfortable and stable after a few weeks, then you can try stopping the Symbicort and see how you do.

## 2022-12-01 NOTE — Assessment & Plan Note (Signed)
Much better control, began with initiation of Xolair. Plan-replace flutter valve.  Labs for CBC with differential, IgE.  Try reducing Symbicort to 1 puff twice daily.

## 2022-12-04 LAB — IGE: IgE (Immunoglobulin E), Serum: 870 kU/L — ABNORMAL HIGH (ref <=114)

## 2022-12-08 ENCOUNTER — Ambulatory Visit (INDEPENDENT_AMBULATORY_CARE_PROVIDER_SITE_OTHER): Payer: 59

## 2022-12-08 VITALS — BP 104/66 | HR 67 | Temp 98.0°F | Resp 14 | Ht 62.0 in | Wt 106.8 lb

## 2022-12-08 DIAGNOSIS — J4489 Other specified chronic obstructive pulmonary disease: Secondary | ICD-10-CM

## 2022-12-08 MED ORDER — OMALIZUMAB 150 MG/ML ~~LOC~~ SOSY
300.0000 mg | PREFILLED_SYRINGE | Freq: Once | SUBCUTANEOUS | Status: AC
Start: 2022-12-08 — End: 2022-12-08
  Administered 2022-12-08: 300 mg via SUBCUTANEOUS
  Filled 2022-12-08: qty 2

## 2022-12-08 NOTE — Progress Notes (Signed)
Diagnosis: Asthma  Provider:  Chilton Greathouse MD  Procedure: Injection  Xolair (Omalizumab), Dose: 300 mg, Site: subcutaneous, Number of injections: 2  Post Care:  lert amd right arm injection  Discharge: Condition: Good, Destination: Home . AVS Declined  Performed by:  Rico Ala, LPN

## 2023-01-03 ENCOUNTER — Other Ambulatory Visit: Payer: Self-pay | Admitting: Internal Medicine

## 2023-01-03 DIAGNOSIS — J4550 Severe persistent asthma, uncomplicated: Secondary | ICD-10-CM

## 2023-01-05 ENCOUNTER — Ambulatory Visit: Payer: 59

## 2023-01-05 VITALS — BP 111/69 | HR 79 | Temp 98.1°F | Resp 20 | Ht 62.0 in | Wt 106.8 lb

## 2023-01-05 DIAGNOSIS — J4489 Other specified chronic obstructive pulmonary disease: Secondary | ICD-10-CM

## 2023-01-05 MED ORDER — OMALIZUMAB 150 MG/ML ~~LOC~~ SOSY
300.0000 mg | PREFILLED_SYRINGE | Freq: Once | SUBCUTANEOUS | Status: AC
  Administered 2023-01-05: 300 mg via SUBCUTANEOUS
  Filled 2023-01-05: qty 2

## 2023-01-05 NOTE — Progress Notes (Signed)
Diagnosis: Asthma-COPD overlap syndrome   Provider:  Chilton Greathouse MD  Procedure: Injection  Xolair (Omalizumab), Dose: 300 mg, Site: subcutaneous, Number of injections: 2  Injection Site(s): Left lower quad. abdomen and Right lower quad. abdomne  Post Care: Patient declined observation  Discharge: Condition: Good, Destination: Home . AVS Declined  Performed by:  Wyvonne Lenz, RN

## 2023-02-02 ENCOUNTER — Ambulatory Visit (INDEPENDENT_AMBULATORY_CARE_PROVIDER_SITE_OTHER): Payer: 59 | Admitting: *Deleted

## 2023-02-02 VITALS — BP 105/65 | HR 77 | Temp 97.8°F | Resp 16 | Ht 62.0 in | Wt 107.6 lb

## 2023-02-02 DIAGNOSIS — J4489 Other specified chronic obstructive pulmonary disease: Secondary | ICD-10-CM | POA: Diagnosis not present

## 2023-02-02 MED ORDER — OMALIZUMAB 150 MG/ML ~~LOC~~ SOSY
300.0000 mg | PREFILLED_SYRINGE | Freq: Once | SUBCUTANEOUS | Status: AC
Start: 2023-02-02 — End: 2023-02-02
  Administered 2023-02-02: 300 mg via SUBCUTANEOUS
  Filled 2023-02-02: qty 2

## 2023-02-02 NOTE — Progress Notes (Signed)
Diagnosis: Asthma  Provider:  Chilton Greathouse MD  Procedure: Injection  Xolair (Omalizumab), Dose: 300 mg, Site: subcutaneous, Number of injections: 2  Injection Site(s): Right lower quad. abdomne  Post Care: Observation period completed  Discharge: Condition: Good, Destination: Home . AVS Declined  Performed by:  Forrest Moron, RN

## 2023-02-20 ENCOUNTER — Telehealth: Payer: Self-pay | Admitting: Pharmacist

## 2023-02-20 NOTE — Telephone Encounter (Signed)
 Submitted a Prior Authorization RENEWAL request to [facility] for XOLAIR  via fax. Will update once we receive a response.  Case #: [id number] Fax: [phone] Phone: [phone]  Sherry Pennant, PharmD, MPH, BCPS, CPP Clinical Pharmacist (Rheumatology and Pulmonology)

## 2023-02-21 NOTE — Telephone Encounter (Signed)
 Received notification from [facility] regarding a prior authorization for XOLAIR . Authorization has been APPROVED from 02/21/2023 to 02/20/2024. Approval letter sent to scan center.  Patient must continue to fill through [facility]: [phone]  Authorization # [id number]  Sherry Pennant, PharmD, MPH, BCPS, CPP Clinical Pharmacist (Rheumatology and Pulmonology)

## 2023-03-02 ENCOUNTER — Ambulatory Visit: Payer: 59

## 2023-03-09 ENCOUNTER — Ambulatory Visit: Payer: 59

## 2023-03-23 ENCOUNTER — Ambulatory Visit: Admitting: *Deleted

## 2023-03-23 VITALS — BP 99/58 | HR 72 | Temp 98.4°F | Resp 16 | Ht 62.0 in | Wt 110.2 lb

## 2023-03-23 DIAGNOSIS — J4489 Other specified chronic obstructive pulmonary disease: Secondary | ICD-10-CM

## 2023-03-23 MED ORDER — OMALIZUMAB 150 MG/ML ~~LOC~~ SOSY
300.0000 mg | PREFILLED_SYRINGE | Freq: Once | SUBCUTANEOUS | Status: AC
  Administered 2023-03-23: 300 mg via SUBCUTANEOUS
  Filled 2023-03-23: qty 2

## 2023-03-23 NOTE — Progress Notes (Signed)
 Diagnosis: Asthma  Provider:  Chilton Greathouse MD  Procedure: Injection  Xolair (Omalizumab), Dose: 300 mg, Site: subcutaneous, Number of injections: 2  Injection Site(s): Left arm and Right arm  Post Care: Patient declined observation  Discharge: Condition: Good, Destination: Home . AVS Declined  Performed by:  Leonides Schanz, RN

## 2023-04-04 ENCOUNTER — Other Ambulatory Visit: Payer: Self-pay | Admitting: Family Medicine

## 2023-04-04 DIAGNOSIS — R221 Localized swelling, mass and lump, neck: Secondary | ICD-10-CM

## 2023-04-04 DIAGNOSIS — J4550 Severe persistent asthma, uncomplicated: Secondary | ICD-10-CM

## 2023-04-04 DIAGNOSIS — K769 Liver disease, unspecified: Secondary | ICD-10-CM

## 2023-04-04 NOTE — Progress Notes (Signed)
 H/o Chronic and worsening Asthma despite Xolair and Budesonide-fomoterol. Increased frequency of attacks and worsening SOB/DOE.  PT also found to have R thyroid fullness and L upper neck fullness on palpation.  H/o Hepatic cyst.

## 2023-04-11 ENCOUNTER — Encounter: Payer: Self-pay | Admitting: Internal Medicine

## 2023-04-13 ENCOUNTER — Ambulatory Visit
Admission: RE | Admit: 2023-04-13 | Discharge: 2023-04-13 | Disposition: A | Discharge status: Home / Self Care | Source: Ambulatory Visit | Attending: Family Medicine | Admitting: Family Medicine

## 2023-04-13 DIAGNOSIS — R221 Localized swelling, mass and lump, neck: Secondary | ICD-10-CM

## 2023-04-20 ENCOUNTER — Ambulatory Visit (INDEPENDENT_AMBULATORY_CARE_PROVIDER_SITE_OTHER)

## 2023-04-20 VITALS — BP 114/74 | HR 70 | Temp 98.3°F | Resp 16 | Ht 62.0 in | Wt 109.0 lb

## 2023-04-20 DIAGNOSIS — J4489 Other specified chronic obstructive pulmonary disease: Secondary | ICD-10-CM | POA: Diagnosis not present

## 2023-04-20 MED ORDER — OMALIZUMAB 150 MG/ML ~~LOC~~ SOSY
300.0000 mg | PREFILLED_SYRINGE | Freq: Once | SUBCUTANEOUS | Status: AC
  Administered 2023-04-20: 300 mg via SUBCUTANEOUS
  Filled 2023-04-20: qty 2

## 2023-04-20 NOTE — Progress Notes (Signed)
 Diagnosis: Asthma  Provider:  Chilton Greathouse MD  Procedure: Injection  Xolair (Omalizumab), Dose: 300 mg, Site: subcutaneous, Number of injections: 2  Injection Site(s): Left arm and Right arm  Post Care: Patient declined observation  Discharge: Condition: Good, Destination: Home . AVS Declined  Performed by:  Loney Hering, LPN

## 2023-04-26 ENCOUNTER — Other Ambulatory Visit

## 2023-05-15 ENCOUNTER — Ambulatory Visit
Admission: RE | Admit: 2023-05-15 | Discharge: 2023-05-15 | Disposition: A | Discharge status: Home / Self Care | Source: Ambulatory Visit | Attending: Family Medicine | Admitting: Family Medicine

## 2023-05-15 DIAGNOSIS — J4550 Severe persistent asthma, uncomplicated: Secondary | ICD-10-CM

## 2023-05-18 ENCOUNTER — Ambulatory Visit (INDEPENDENT_AMBULATORY_CARE_PROVIDER_SITE_OTHER)

## 2023-05-18 VITALS — BP 107/68 | Temp 98.3°F | Resp 18 | Ht 62.0 in | Wt 108.0 lb

## 2023-05-18 DIAGNOSIS — J4489 Other specified chronic obstructive pulmonary disease: Secondary | ICD-10-CM | POA: Diagnosis not present

## 2023-05-18 MED ORDER — OMALIZUMAB 150 MG/ML ~~LOC~~ SOSY
300.0000 mg | PREFILLED_SYRINGE | Freq: Once | SUBCUTANEOUS | Status: AC
  Administered 2023-05-18: 300 mg via SUBCUTANEOUS
  Filled 2023-05-18: qty 2

## 2023-05-18 NOTE — Progress Notes (Signed)
 Diagnosis: Asthma  Provider:  Mannam, Praveen MD  Procedure: Injection  Xolair  (Omalizumab ), Dose: 300 mg, Site: subcutaneous, Number of injections: 2  Injection Site(s): Left arm and Right arm  Post Care:     Discharge: Condition: Good, Destination: Home . AVS Declined  Performed by:  Chabeli Barsamian, RN

## 2023-06-04 NOTE — Progress Notes (Addendum)
 Subjective:    Patient ID: Sara Trevino, female    DOB: 09/03/1965, 58 y.o.   MRN: [id number]  HPI  59 year old female never smoker followed for COPD mixed type/chronic cough, allergic rhinitis, nasal polyps Allergy  Vaccine-Dr. Almeda Jacobs x 3 years w/o clear benefit CT chest 11/2013 clear lungs  CT sinus 12/2013 neg for acute process  Allergy  profile 07/21/14- Total IgE 249  Elevated for dust mite. Grass pollens, tree pollens CBC w diff 07/21/14- EOS  13.6 H Allergy  skin test 04/21/11 elevated dog, weed pollens, tree pollens molds  She complains primarily of coughing 5 years. Occasional white or yellow sputum. 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 Does not recognize association between symptoms and workplace Had seen Dr. Anastacio Balm for second opinion and he also thought Nucala might be appropriate She was not interested in [place] because it is too new. PFT 02/23/14 moderate obstructive airways disease, significant response to bronchodilator, air trapping, normal diffusion, normal TLC. FEV1/FVC 0.68,  She was seen at [facility] by ENT/ Dr Senior who identified nasal polyps and has scheduled a CT of sinuses and recommended bilateral endoscopic sinus surgery Quantiferon Gold TB assay Positive 2018  PFT 11/02/16-moderately severe obstructive airways disease with insignificant response to bronchodilator. Diffusion slightly reduced ----------------------------------------------------------------------------------------------r.    12/01/22- 58 year old female never smoker( + second-hand) followed for / AsthmaCOPD mixed type/ Bronchiectasis, chronic cough, allergic rhinitis, nasal polyps, + Quantiferon Gold/ AFB culture Neg Elevated IgE and Eosinophils, Started Xolair  12/05/19  - Proair  hfa, Symbicort  160, Xolair        Pt states Xolair  injections help with coughing and helping her feel better ------Breathing is good   ACT 23  She has been doing quite well with no recent exacerbations.  Some cough.  She brought outside labs which look good and are scanned.  We talked about what kind of follow-up would be appropriate and how long to continue Xolair .  We have decided to try tapering off of Symbicort  to see whether she still needs it.  Stopping Xolair  for trial could be a next step. Chest x-ray report looks very good.  CT scan might show residual bronchiectasis but we decided to defer that for now. CXR 05/11/22- FINDINGS: The cardiac silhouette mediastinal and hilar contours are within normal limits. The lungs are clear. Mild hyperinflation but no infiltrates, edema or effusions. No pulmonary lesions. Bilateral nipple shadows are noted. The bony thorax is intact. IMPRESSION: Mild hyperinflation but no acute pulmonary findings.  06/05/23- 58 year old female never smoker( + second-hand) followed for Asthma/COPD mixed type/ Bronchiectasis, chronic cough, allergic rhinitis, nasal polyps, + Quantiferon Gold/ AFB culture Neg Elevated IgE and Eosinophils, Started Xolair  12/05/19  - Proair  hfa, Symbicort  160, Xolair        Pt states Xolair  injections help with coughing and helping her feel better -----Coughing ( yellow mucus ). Started March. Discussed whether to continue Xolair  injections.  Discussed the use of AI scribe software for clinical note transcription with the patient, who gave verbal consent to proceed.  History of Present Illness   Sara Trevino is a 58 year old female with chronic bronchitis and bronchiectasis who presents with persistent cough and yellow sputum production.  She experiences a persistent cough with yellow sputum production. A CT scan shows chronic bronchitis with bronchiectasis in the upper right lung. She has been on Xolair  injections for two to three years, which have improved her health. Her IgE levels  were elevated last year. She has used azithromycin  in the past with good effect and is  considering it again. Doxycycline  was suggested, but she has concerns about its interaction with food and effects on her teeth. No recent use of azithromycin  since early this year. Coughing occurs with deep breaths.      Discussed management of recent symptoms as mild bronchitis  Assessment and Plan:    Cough with Yellow Sputum Persistent cough with yellow sputum likely due to bacterial infection in chronic bronchitis and bronchiectasis. CT shows stable bronchiectasis. - Prescribed azithromycin  (Z-Pak). - Discussed stopping Xolair  injections, monitor for 6-12 months, consider restarting if needed.  Bronchiectasis Bronchiectasis in upper right lung with airway distortion, chronic condition due to past infections.  Chronic Bronchitis Chronic bronchitis with airway damage in upper right lung, causing irritation and mucus production.       CT chest 05/15/23 IMPRESSION: 1. No acute cardiopulmonary process. 2. Minimal cylindrical bronchiectasis in the left upper lobe, unchanged. 3. Hepatic cysts.  ROS-see HPI  + = positive Constitutional:    weight loss, night sweats, fevers, chills, fatigue, lassitude. HEENT:    headaches, difficulty swallowing, tooth/dental problems, +sore throat,       +sneezing, +itching, ear ache, +nasal congestion, post nasal drip, snoring CV:    chest pain, orthopnea, PND, swelling in lower extremities, anasarca,                                                     dizziness, palpitations Resp:   +shortness of breath with exertion or at rest.                productive cough,   +non-productive cough, coughing up of blood.              change in color of mucus.   wheezing.   Skin:    rash or lesions. GI:  No-   heartburn, indigestion, abdominal pain, nausea, vomiting, diarrhea,                 change in bowel habits, loss of appetite GU: dysuria, change in color of urine, no urgency or frequency.   flank pain. MS:   joint pain, +stiffness, decreased range of  motion, back pain. Neuro-     nothing unusual Psych:  change in mood or affect.  depression or anxiety.   memory loss.    Objective:   Physical Exam OBJ- Physical Exam General- Alert, Oriented, Affect-appropriate, Distress- none acute, + thin Skin- rash-none, lesions- none, excoriation- none Lymphadenopathy- none Head- atraumatic            Eyes- Gross vision intact, PERRLA, conjunctivae and secretions clear            Ears- Hearing, canals-normal            Nose- + Mild turbinate edema, no-Septal dev, mucus, polyps, erosion, perforation             Throat- Mallampati II , mucosa clear , drainage- none, tonsils- atrophic Neck- flexible , trachea midline, no stridor , thyroid  nl, carotid no bruit Chest - symmetrical excursion , unlabored           Heart/CV- RRR , no murmur , no gallop  , no rub, nl s1 s2                           -  JVD- none , edema- none, stasis changes- none, varices- none           Lung-  Cough+light,  rhonchi- none, wheeze-none , dullness-none, rub- none           Chest wall-  Abd-  Br/ Gen/ Rectal- Not done, not indicated Extrem- cyanosis- none, clubbing, none, atrophy- none, strength- nl Neuro- grossly intact to observation     Assessment & Plan:

## 2023-06-05 ENCOUNTER — Encounter: Payer: Self-pay | Admitting: Internal Medicine

## 2023-06-05 ENCOUNTER — Ambulatory Visit: Payer: 59 | Admitting: Internal Medicine

## 2023-06-05 VITALS — BP 128/80 | HR 74 | Ht 62.0 in | Wt 107.0 lb

## 2023-06-05 DIAGNOSIS — Z7722 Contact with and (suspected) exposure to environmental tobacco smoke (acute) (chronic): Secondary | ICD-10-CM

## 2023-06-05 DIAGNOSIS — J4489 Other specified chronic obstructive pulmonary disease: Secondary | ICD-10-CM | POA: Diagnosis not present

## 2023-06-05 DIAGNOSIS — J479 Bronchiectasis, uncomplicated: Secondary | ICD-10-CM

## 2023-06-05 MED ORDER — AZITHROMYCIN 250 MG PO TABS: ORAL_TABLET | ORAL | 0 refills | Status: AC

## 2023-06-05 NOTE — Patient Instructions (Signed)
 Script sent for Zpak antibiotic. If it doesn't help, let me know.  You can decide when and if you want to try stopping your Xolair  shots.

## 2023-06-15 ENCOUNTER — Ambulatory Visit

## 2023-06-15 VITALS — BP 104/67 | Temp 82.0°F | Resp 16 | Ht 62.0 in | Wt 107.8 lb

## 2023-06-15 DIAGNOSIS — J4489 Other specified chronic obstructive pulmonary disease: Secondary | ICD-10-CM

## 2023-06-15 MED ORDER — OMALIZUMAB 150 MG/ML ~~LOC~~ SOSY
300.0000 mg | PREFILLED_SYRINGE | Freq: Once | SUBCUTANEOUS | Status: AC
  Administered 2023-06-15: 300 mg via SUBCUTANEOUS
  Filled 2023-06-15: qty 2

## 2023-06-15 NOTE — Progress Notes (Signed)
 Diagnosis: Asthma  Provider:  Mannam, Praveen MD  Procedure: Injection  Xolair  (Omalizumab ), Dose: 300 mg, Site: subcutaneous, Number of injections: 2  Injection Site(s): Left arm and Right arm   Discharge: Condition: Good, Destination: Home . AVS Declined  Performed by:  Shirly Dow, RN

## 2023-06-18 ENCOUNTER — Telehealth: Payer: Self-pay

## 2023-06-18 NOTE — Telephone Encounter (Signed)
 Reason for CRM: Emily ZOLAIR - Patient Saftey team can be reached at [phone] ext [phone] for Ref # [id number] - requesting a clinical call back for a patient safety report for an adverse event. Unable to file under patient, as they do not have patient name.  Ref Number for call is [id number].   Spoke with Sonya from patient safety team. [facility] experiencing itchiness & depression to Xolair . April 10th was when report to side effects was placed. Sonya is faxing over form for CY to fill out with information that is needed. This fax has not been received yet. I ATC pt x1 to get more information about this, no answer- left message to call back.  Per lov note "You can decide when and if you want to try stopping your Xolair  shots."  Routing to Amy to follow up on this with Dr. Linder Revere

## 2023-06-19 NOTE — Telephone Encounter (Addendum)
 Called [employer] at [phone] ext [phone].  Spoke with Julie K., RN.  Concha Deed states all information is documented and forms are in process to be faxed.  Fax normally takes 3-4 business days to process.  Verified both fax numbers of [phone] and [phone].    Patient experienced a rxn to Xolair  inj on 04/26/2023.  Forms will have information that Dr. Linder Revere needs to fill out and fax back to [facility] Patient Safety Team.

## 2023-06-27 NOTE — Assessment & Plan Note (Signed)
 Watch response to Zpak

## 2023-06-27 NOTE — Assessment & Plan Note (Addendum)
 She chooses to continue Xolair  injection for now- reconsider later. Plan- Zpak for bronchitis

## 2023-07-04 ENCOUNTER — Telehealth (HOSPITAL_BASED_OUTPATIENT_CLINIC_OR_DEPARTMENT_OTHER): Payer: Self-pay

## 2023-07-04 MED ORDER — BUDESONIDE-FORMOTEROL FUMARATE 160-4.5 MCG/ACT IN AERO: INHALATION_SPRAY | RESPIRATORY_TRACT | 4 refills | Status: AC

## 2023-07-04 MED ORDER — BUDESONIDE-FORMOTEROL FUMARATE 160-4.5 MCG/ACT IN AERO: INHALATION_SPRAY | RESPIRATORY_TRACT | 1 refills | Status: DC

## 2023-07-04 NOTE — Telephone Encounter (Signed)
 Pt.notified

## 2023-07-04 NOTE — Addendum Note (Signed)
 Addended by: Rosa College D on: 07/04/2023 02:35 PM   Modules accepted: Orders

## 2023-07-04 NOTE — Telephone Encounter (Signed)
 I sent a Symbicort  refill [facility] [address] so she can get it quickly. I also sent a one year refill to her mail-order pharmacy.

## 2023-07-04 NOTE — Telephone Encounter (Signed)
 Copied from CRM [id number]. Topic: Clinical - Prescription Issue >> Jul 04, 2023  9:13 AM [name] wrote: Reason for CRM: Patient is requesting the generic of budesonide -formoterol  (SYMBICORT ) 160-4.5 MCG/ACT inhaler sent to her pharmacy, states she is completely out now.

## 2023-07-11 ENCOUNTER — Telehealth: Payer: Self-pay

## 2023-07-11 MED ORDER — BUDESONIDE-FORMOTEROL FUMARATE 160-4.5 MCG/ACT IN AERO: 2.0000 | INHALATION_SPRAY | Freq: Two times a day (BID) | RESPIRATORY_TRACT | 11 refills | Status: AC

## 2023-07-11 NOTE — Telephone Encounter (Signed)
 Copied from CRM [id number]. Topic: Clinical - Prescription Issue >> Jul 11, 2023 10:56 AM [name] wrote: Reason for CRM: Patient [phone] states needs budesonide -formoterol  (SYMBICORT ) 160-4.5 MCG/ACT inhaler to be 90 days supply for [facility] cost [facility]. Patient is almost out of medication. [facility] not [facility]   [id number] [place] - [address] [place] [address] Phone: [phone] Fax: [phone]  LVM to inform patient we refilled her medication to the correct pharmacy

## 2023-07-13 ENCOUNTER — Ambulatory Visit (INDEPENDENT_AMBULATORY_CARE_PROVIDER_SITE_OTHER): Admitting: *Deleted

## 2023-07-13 VITALS — BP 111/69 | HR 71 | Temp 98.4°F | Resp 16 | Ht 62.0 in | Wt 108.6 lb

## 2023-07-13 DIAGNOSIS — J4489 Other specified chronic obstructive pulmonary disease: Secondary | ICD-10-CM | POA: Diagnosis not present

## 2023-07-13 MED ORDER — OMALIZUMAB 150 MG/ML ~~LOC~~ SOSY
300.0000 mg | PREFILLED_SYRINGE | Freq: Once | SUBCUTANEOUS | Status: AC
  Administered 2023-07-13: 300 mg via SUBCUTANEOUS
  Filled 2023-07-13: qty 2

## 2023-07-13 NOTE — Progress Notes (Signed)
 Diagnosis: Acute Anemia  Provider:  Mannam, Praveen MD  Procedure: Injection  Xolair  (Omalizumab ), Dose: 300 mg, Site: subcutaneous, Number of injections: 2  Injection Site(s): Left arm  Post Care: Observation period completed  Discharge: Condition: Good, Destination: Home . AVS Declined  Performed by:  Trudy Lamarr LABOR, RN

## 2023-08-10 ENCOUNTER — Ambulatory Visit

## 2023-08-10 MED ORDER — OMALIZUMAB 150 MG/ML ~~LOC~~ SOSY: 300.0000 mg | PREFILLED_SYRINGE | Freq: Once | SUBCUTANEOUS | Status: DC

## 2023-08-13 ENCOUNTER — Encounter: Payer: Self-pay | Admitting: Internal Medicine

## 2023-08-17 ENCOUNTER — Ambulatory Visit

## 2023-08-17 VITALS — BP 111/68 | HR 68 | Temp 98.5°F | Resp 14 | Ht 62.0 in | Wt 109.2 lb

## 2023-08-17 DIAGNOSIS — J4489 Other specified chronic obstructive pulmonary disease: Secondary | ICD-10-CM

## 2023-08-17 MED ORDER — OMALIZUMAB 150 MG/ML ~~LOC~~ SOSY
300.0000 mg | PREFILLED_SYRINGE | Freq: Once | SUBCUTANEOUS | Status: AC
  Administered 2023-08-17: 300 mg via SUBCUTANEOUS
  Filled 2023-08-17: qty 2

## 2023-08-17 NOTE — Progress Notes (Signed)
 Diagnosis: Asthma  Provider:  Mannam, Praveen MD  Procedure: Injection  Xolair  (Omalizumab ), Dose: 300 mg, Site: subcutaneous, Number of injections: 2  Injection Site(s): Left arm and Right arm  Post Care: left and right injections  Discharge: Condition: Good, Destination: Home . AVS Declined  Performed by:  Maximiano JONELLE Pouch, LPN

## 2023-10-08 ENCOUNTER — Ambulatory Visit

## 2023-10-10 ENCOUNTER — Other Ambulatory Visit: Payer: Self-pay | Admitting: Internal Medicine

## 2023-10-10 DIAGNOSIS — J4550 Severe persistent asthma, uncomplicated: Secondary | ICD-10-CM

## 2023-10-11 NOTE — Telephone Encounter (Signed)
 Pt requesting refill of specialty medication, routing to Rx team.

## 2023-10-11 NOTE — Telephone Encounter (Signed)
 Refill sent for [facility]IR  to [facility]: [phone]  Dose: 300mg  [place] every 4 weeks   Last OV: 06/05/23   Provider: Dr. Neysa  Next OV: due May 2026 - not yet scheduled   Aleck Puls, PharmD, BCPS Clinical Pharmacist  [facility]

## 2023-10-12 ENCOUNTER — Ambulatory Visit

## 2023-10-12 VITALS — BP 108/69 | HR 76 | Temp 98.4°F | Resp 20 | Ht 62.0 in | Wt 108.6 lb

## 2023-10-12 DIAGNOSIS — J4489 Other specified chronic obstructive pulmonary disease: Secondary | ICD-10-CM

## 2023-10-12 MED ORDER — OMALIZUMAB 150 MG/ML ~~LOC~~ SOSY
300.0000 mg | PREFILLED_SYRINGE | Freq: Once | SUBCUTANEOUS | Status: AC
  Administered 2023-10-12: 300 mg via SUBCUTANEOUS
  Filled 2023-10-12: qty 2

## 2023-10-12 NOTE — Progress Notes (Signed)
 Diagnosis: Asthma  Provider:  Mannam, Praveen MD  Procedure: Injection  Xolair  (Omalizumab ), Dose: 300 mg, Site: subcutaneous, Number of injections: 2  Injection Site(s): Left arm and Right arm  Post Care: Patient declined observation  Discharge: Condition: Stable, Destination: Home . AVS Provided  Performed by:  Rocky FORBES Sar, RN   Patient stated she will call us  if she needs to schedule additional appointments.

## 2023-10-12 NOTE — Patient Instructions (Signed)
 Please call us  if you need to schedule any additional doses. Thank you!

## 2023-11-02 ENCOUNTER — Other Ambulatory Visit: Payer: Self-pay | Admitting: Obstetrics & Gynecology

## 2023-11-02 DIAGNOSIS — Z1231 Encounter for screening mammogram for malignant neoplasm of breast: Secondary | ICD-10-CM

## 2023-12-03 ENCOUNTER — Ambulatory Visit

## 2023-12-05 ENCOUNTER — Telehealth: Payer: Self-pay

## 2023-12-05 ENCOUNTER — Ambulatory Visit

## 2023-12-05 NOTE — Telephone Encounter (Unsigned)
 Copied from CRM [id number]. Topic: Appointments - Transfer of Care >> Dec 05, 2023  9:31 AM Ismael A wrote: Pt is requesting to transfer FROM: Dr. Neysa Pt is requesting to transfer TO: Dr. Geronimo Reason for requested transfer: Dr. Neysa is retiring It is the responsibility of the team the patient would like to transfer to (Dr. Geronimo) to reach out to the patient if for any reason this transfer is not acceptable.

## 2023-12-05 NOTE — Progress Notes (Unsigned)
 OV 12/06/2023  Subjective:  Patient ID: Sara Trevino, female , DOB: 1965-06-13 , age 58 y.o. , MRN: [id number] , ADDRESS: [address] [place] [address] PCP Sara Alm PARAS, MD Patient Care Team: Sara Alm PARAS, MD as PCP - General (Family Medicine) Sara Belvie BRAVO, MD as Attending Physician (Pulmonary Disease)  This Provider for this visit: Treatment Team:  Attending Provider: Geronimo Amel, MD    12/06/2023 -   Chief Complaint  Patient presents with   Asthma    Pt stated 2 weeks ago she came SOB and Prod cough (phlegm yellow)     HPI Sara Trevino 58 y.o. -Sara Trevino is a 58 year old female with asthma and bronchiectasis who presents with increased respiratory symptoms.  She is a transfer of care from Dr. Reggy Trevino who is retiring at the end of 2025.  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  She has been in [place]  for the last 20 years.  Last visit to [place] was in 2019.   Her medical history includes asthma and bronchiectasis, with high IgE levels [most recent 800].  And eosinophilia. She has a known dust mite allergy .  She has had other CT scans of the chest as well.  I personally visualized all this and the bronchiectasis is very mild and scattered.  Most recent 1 is in the left upper lobe.  This was in March 2025.  She has been on Xolair  at least since 2022.  And then this really helped her cough and symptoms and sometime in 2023 reduced it every 4 weeks.  In September 2025 she quit taking it altogether because she was doing really well but she feels she is relapse now.  She is planning to restart her Xolair .  However she is also concerned about long-term complications of Xolair  and she is looking for alternative such as Dupixent.  She also has a question about testing for parasitic infection test because she is on anti-IgE drug.  Will get this started out with the pharmacology consult.  This will  be through [place] who I talked briefly.  Since coming off the Xolair :Over the past two weeks, she has experienced increased respiratory symptoms, including shortness of breath and a sensation of being 'awake' throughout her body. Her chronic cough, which began in 2014, has worsened recently.  Primary care physician put her on Decadron.  She is reporting 80 mg of Decadron tapering over 3 days to 40 mg and stopping.  I checked with the pharmacy and the dose seems awfully high.  She says she cannot do prednisone  and she insists it was the Decadron.  She is 50% better not not fully.  She is also on day 3 out of 5 of azithromycin  through primary care doctor.   Of note: She has had three flare-ups this year requiring prednisone , with short courses of treatment. During the COVID-19 pandemic, she worked from home and did not experience any respiratory illnesses, but has had flare-ups since returning to the office.      CT Chest data from date: April 2025  - personally visualized and independently interpreted : yes - my findings are: agree. Vermild and chronic arrative & Impression  CLINICAL DATA:  Shortness of breath   EXAM: CT CHEST WITHOUT CONTRAST   TECHNIQUE: Multidetector CT imaging of the chest was performed following the standard protocol without IV contrast.   RADIATION DOSE REDUCTION: This  exam was performed according to the departmental dose-optimization program which includes automated exposure control, adjustment of the mA and/or kV according to patient size and/or use of iterative reconstruction technique.   COMPARISON:  Chest CT 11/29/2021.   FINDINGS: Cardiovascular: No significant vascular findings. Normal heart size. No pericardial effusion.   Mediastinum/Nodes: No enlarged mediastinal or axillary lymph nodes. Thyroid  gland, trachea, and esophagus demonstrate no significant findings.   Lungs/Pleura: There is minimal cylindrical bronchiectasis in the left upper lobe  similar to the prior study. There is no new focal lung infiltrate, pulmonary nodule, pleural effusion or pneumothorax.   Upper Abdomen: Multiple hepatic cysts are present measuring up to 4 cm. There is a calcified granuloma in the right lobe of the liver.   Musculoskeletal: No chest wall mass or suspicious bone lesions identified.   IMPRESSION: 1. No acute cardiopulmonary process. 2. Minimal cylindrical bronchiectasis in the left upper lobe, unchanged. 3. Hepatic cysts.     Electronically Signed   By: Sara Trevino M.D.   On: 05/20/2023 20:53      PFT     Latest Ref Rng & Units 11/02/2016    2:21 PM 02/23/2014    8:26 AM  PFT Results  FVC-Pre L 2.15  2.28   FVC-Predicted Pre % 68  71   FVC-Post L 2.30  2.68   FVC-Predicted Post % 73  84   Pre FEV1/FVC % % 63  68   Post FEV1/FCV % % 64  70   FEV1-Pre L 1.36  1.56   FEV1-Predicted Pre % 53  60   FEV1-Post L 1.47  1.87   DLCO uncorrected ml/min/mmHg 19.72  23.21   DLCO UNC% % 81  95   DLCO corrected ml/min/mmHg 18.85    DLCO COR %Predicted % 77    DLVA Predicted % 105  120   TLC L 5.51  5.25   TLC % Predicted % 109  104   RV % Predicted % 184  162     Latest Reference Range & Units 07/21/14 12:24 06/22/16 10:03 09/21/16 11:55 05/21/17 15:35 02/04/18 14:51 12/05/19 10:09 04/21/20 11:54 12/01/22 09:53  Eosinophils Absolute 0.0 - 0.7 K/uL 0.7 0.4 0.5 0.6 0.4 0.3 0.3 0.4    Latest Reference Range & Units 08/14/11 16:02 07/21/14 12:24 09/21/16 11:55 05/21/17 15:35 02/04/18 14:51 12/05/19 10:09 12/01/22 09:53  IgE (Immunoglobulin E), Serum <OR=114 kU/L 323.0 (H) 249 (H) 301 (H) 509 (H) 915 (H) 394 (H) 870 (H)  (H): Data is abnormally high   Latest Reference Range & Units 07/21/14 12:24 06/22/16 10:03 09/21/16 11:55 05/21/17 15:35 02/04/18 14:51 12/05/19 10:09 04/21/20 11:54 12/01/22 09:53  Eosinophils Absolute 0.0 - 0.7 K/uL 0.7 0.4 0.5 0.6 0.4 0.3 0.3 0.4   LAB RESULTS last 96 hours No results found.   Latest  Reference Range & Units 08/14/11 16:02 07/21/14 12:24 09/21/16 11:55 05/21/17 15:35 02/04/18 14:51 12/05/19 10:09 12/01/22 09:53  IgE (Immunoglobulin E), Serum <OR=114 kU/L 323.0 (H) 249 (H) 301 (H) 509 (H) 915 (H) 394 (H) 870 (H)  Allergen, D pternoyssinus,d7 kU/L  11.70 (H)       Cat Dander kU/L  <0.10       Dog Dander kU/L  <0.10       Bahia Grass kU/L  <0.10       Bermuda Grass kU/L  <0.10       Timothy Grass kU/L  0.16 (H)       Aspergillus fumigatus, m3 kU/L  <0.10  Elm IgE kU/L  0.15 (H)       Sycamore Tree kU/L  <0.10       Common Ragweed kU/L  <0.10       Curvularia lunata kU/L  <0.10       D. farinae kU/L  16.40 (H)       Helminthosporium halodes kU/L  <0.10       Stemphylium Botryosum kU/L  <0.10       Candida albicans kU/L  <0.10       Fescue kU/L  0.24 (H)       Goldenrod kU/L  <0.10       Allergen,Goose feathers, e70 kU/L  <0.10       House Dust Hollister kU/L  0.63 (H)       Box Elder IgE kU/L  <0.10       G005 Rye, Perennial kU/L  0.33 (H)       G009 Red Top kU/L  0.39 (H)       (H): Data is abnormally high     has a past medical history of Allergy , Asthma, Cough, and SOB (shortness of breath).   reports that she has never smoked. She has never used smokeless tobacco.  Past Surgical History:  Procedure Laterality Date   Trevino PH STUDY N/A 02/13/2017   Procedure: Trevino PH STUDY;  Surgeon: Rosalie Kitchens, MD;  Location: [facility];  Service: Endoscopy;  Laterality: N/A;   COLONOSCOPY     ESOPHAGOGASTRODUODENOSCOPY (EGD) WITH PROPOFOL  N/A 02/13/2017   Procedure: ESOPHAGOGASTRODUODENOSCOPY (EGD) WITH PROPOFOL ;  Surgeon: Rosalie Kitchens, MD;  Location: [facility];  Service: Endoscopy;  Laterality: N/A;   NASAL SINUS SURGERY  2017   UPPER GASTROINTESTINAL ENDOSCOPY      Allergies  Allergen Reactions   Amoxicillin -Pot Clavulanate Diarrhea    Immunization History  Administered Date(s) Administered   Influenza Split 12/17/2014, 10/16/2016   Influenza,  Quadrivalent, Recombinant, Inj, Pf 10/21/2018   Influenza, Seasonal, Injecte, Preservative Fre 01/16/2010   Influenza,inj,Quad PF,6+ Mos 01/05/2014, 11/04/2019, 10/28/2020   Moderna Sars-Covid-2 Vaccination 03/27/2019, 04/24/2019   Pneumococcal Polysaccharide-23 11/09/2016   Tdap 01/16/2010    Family History  Problem Relation Age of Onset   Positive PPD/TB Exposure Mother    Colon cancer Neg Hx    Esophageal cancer Neg Hx    Rectal cancer Neg Hx    Stomach cancer Neg Hx      Current Outpatient Medications:    albuterol  (PROAIR  HFA) 108 (90 Base) MCG/ACT inhaler, INHALE 2 PUFFS INTO THE LUNGS EVERY 6 HOURS AS NEEDED FOR WHEEZING AND FOR SHORTNESS OF BREATH, Disp: 18 g, Rfl: 12   azithromycin  (ZITHROMAX ) 250 MG tablet, 2 today then one daily, Disp: 6 tablet, Rfl: 0   budesonide -formoterol  (SYMBICORT ) 160-4.5 MCG/ACT inhaler, Inhale 2 puffs then rinse mouth, twice daily, Disp: 30.6 g, Rfl: 4   budesonide -formoterol  (SYMBICORT ) 160-4.5 MCG/ACT inhaler, Inhale 2 puffs into the lungs 2 (two) times daily., Disp: 90 each, Rfl: 11   EPINEPHrine  0.3 mg/0.3 mL IJ SOAJ injection, Inject into thigh for severe allergic reaction., Disp: 1 each, Rfl: 11   Multiple Vitamins-Minerals (MULTIVITAMIN PO), Take 1 tablet by mouth daily., Disp: , Rfl:    Respiratory Therapy Supplies (FLUTTER) DEVI, As directed, Disp: 1 each, Rfl: 0   XOLAIR  150 MG/ML prefilled syringe, INJECT 2 SYRINGES UNDER THE SKIN EVERY 4 WEEKS, Disp: 2 mL, Rfl: 5   dexamethasone (DECADRON) 1.5 MG tablet, Take 3 tablets (4.5 mg total) by mouth daily for 3  days, THEN 2 tablets (3 mg total) daily for 2 days, THEN 1 tablet (1.5 mg total) daily for 2 days., Disp: 15 tablet, Rfl: 0      Objective:   Vitals:   12/06/23 0850  BP: 110/68  Pulse: 69  Temp: 98 F (36.7 C)  TempSrc: Oral  SpO2: 97%  Weight: 110 lb 6.4 oz (50.1 kg)  Height: 5' 2 (1.575 m)    Estimated body mass index is 20.19 kg/m as calculated from the following:    Height as of this encounter: 5' 2 (1.575 m).   Weight as of this encounter: 110 lb 6.4 oz (50.1 kg).  @WEIGHTCHANGE @  Filed Weights   12/06/23 0850  Weight: 110 lb 6.4 oz (50.1 kg)     Physical Exam   General: No distress. Looks well O2 at rest: no Cane present: no Sitting in wheel chair: no Frail: no Obese: no Neuro: Alert and Oriented x 3. GCS 15. Speech normal Psych: Pleasant Resp:  Barrel Chest - no.  Wheeze - no, Crackles - no, No overt respiratory distress CVS: Normal heart sounds. Murmurs - no Ext: Stigmata of Connective Tissue Disease - no HEENT: Normal upper airway. PEERL +. No post nasal drip        Assessment/     Assessment & Plan Severe persistent asthma, unspecified whether complicated (HCC)  House dust mite allergy   Elevated IgE level  Eosinophilia, unspecified type  Bronchiectasis with (acute) exacerbation (HCC)    PLAN Patient Instructions     ICD-10-CM   1. Severe persistent asthma, unspecified whether complicated (HCC)  J45.50     2. House dust mite allergy   Z91.09     3. Elevated IgE level  R76.89     4. Eosinophilia, unspecified type  D72.10     5. Bronchiectasis with (acute) exacerbation (HCC)  J47.1       I think you have severe allergic asthma with elevated IgE and eosinophils and mild bronchiectasis that is a complication of this process  - It has several features of a condition called ABPA except Aspergillus/mold antibody was negative in one of the test several years ago  Currently recovering from an exacerbation but only 50% better.  I suspect this exacerbation happened because you held your Xolair   Plan -Recommend 1 more week of Decadron taper to help resolve your current exacerbation  - Awaiting dosage recommendations from my pharmacy team - Check hypersensitive pneumonitis panel   - Continue current medication management with asthma - Refer to [employer] had another pharmacist to go back up on Xolair  with dosing and  frequency to every 2 weeks and to your baseline higher dose -  During pharmacology consult evaluate for any parasitic testing that he might want to done or is indicated - Do pulmonary function test in 2-3 months - At follow-up we can discuss whether we should still continue on Xolair  or changed to another biologic or even consider Brinsupri   Follow-up - 2-3 months with nurse practitioner Dr. Geronimo but after breathing test   ( Level 05 visit E&M 2024: Estb >= 40 min visit type: on-site physical face to visit  in total care time and counseling or/and coordination of care by this undersigned MD - Dr Dorethia Sara. This includes one or more of the following on this same day 12/06/2023: pre-charting, chart review, note writing, documentation discussion of test results, diagnostic or treatment recommendations, prognosis, risks and benefits of management options, instructions, education, compliance or risk-factor reduction. It  excludes time spent by the CMA or office staff in the care of the patient. Actual time 40 min)    FOLLOWUP    Return for - 2-3 months with nurse practitioner Dr. Geronimo but after breathing test.    SIGNATURE    Dr. Dorethia Sara, M.D., F.C.C.P,  Pulmonary and Critical Care Medicine Staff Physician, [facility] Director - Interstitial Lung Disease  Program  Pulmonary Fibrosis [facility] Network at [facility] [place], [place], [address]  Pager: [phone], If no answer or between  15:00h - 7:00h: call [phone] Telephone: [phone]  12:12 PM 12/06/2023

## 2023-12-06 ENCOUNTER — Ambulatory Visit: Admitting: Internal Medicine

## 2023-12-06 ENCOUNTER — Encounter: Payer: Self-pay | Admitting: Internal Medicine

## 2023-12-06 ENCOUNTER — Other Ambulatory Visit: Payer: Self-pay

## 2023-12-06 VITALS — BP 110/68 | HR 69 | Temp 98.0°F | Ht 62.0 in | Wt 110.4 lb

## 2023-12-06 DIAGNOSIS — J471 Bronchiectasis with (acute) exacerbation: Secondary | ICD-10-CM

## 2023-12-06 DIAGNOSIS — D721 Eosinophilia, unspecified: Secondary | ICD-10-CM | POA: Diagnosis not present

## 2023-12-06 DIAGNOSIS — J4550 Severe persistent asthma, uncomplicated: Secondary | ICD-10-CM

## 2023-12-06 DIAGNOSIS — R7689 Other specified abnormal immunological findings in serum: Secondary | ICD-10-CM | POA: Diagnosis not present

## 2023-12-06 DIAGNOSIS — J4489 Other specified chronic obstructive pulmonary disease: Secondary | ICD-10-CM

## 2023-12-06 DIAGNOSIS — Z9109 Other allergy status, other than to drugs and biological substances: Secondary | ICD-10-CM

## 2023-12-06 MED ORDER — DEXAMETHASONE 1.5 MG PO TABS: ORAL_TABLET | ORAL | 0 refills | Status: AC

## 2023-12-06 NOTE — Progress Notes (Signed)
 Order for dexamethasone taper entered - discussed dosing in staff message with Dr. Geronimo.  ATC patient, LVM informing her the medication is at [facility] in [place]. If she has questions, I left my direct line she can call, [phone].

## 2023-12-06 NOTE — Patient Instructions (Addendum)
 ICD-10-CM   1. Severe persistent asthma, unspecified whether complicated (HCC)  J45.50     2. House dust mite allergy   Z91.09     3. Elevated IgE level  R76.89     4. Eosinophilia, unspecified type  D72.10     5. Bronchiectasis with (acute) exacerbation (HCC)  J47.1       I think you have severe allergic asthma with elevated IgE and eosinophils and mild bronchiectasis that is a complication of this process  - It has several features of a condition called ABPA except Aspergillus/mold antibody was negative in one of the test several years ago  Currently recovering from an exacerbation but only 50% better.  I suspect this exacerbation happened because you held your Xolair   Plan -Recommend 1 more week of Decadron taper to help resolve your current exacerbation  - Awaiting dosage recommendations from my pharmacy team - Check hypersensitive pneumonitis panel   - Continue current medication management with asthma - Refer to [facility] had another pharmacist to go back up on Xolair  with dosing and frequency to every 2 weeks and to your baseline higher dose -  During pharmacology consult evaluate for any parasitic testing that he might want to done or is indicated - Do pulmonary function test in 2-3 months - At follow-up we can discuss whether we should still continue on Xolair  or changed to another biologic or even consider Brinsupri   Follow-up - 2-3 months with nurse practitioner Dr. Geronimo but after breathing test

## 2023-12-12 ENCOUNTER — Ambulatory Visit (INDEPENDENT_AMBULATORY_CARE_PROVIDER_SITE_OTHER)

## 2023-12-12 ENCOUNTER — Ambulatory Visit

## 2023-12-12 VITALS — BP 102/68 | HR 70 | Temp 98.3°F | Resp 18 | Ht 62.0 in | Wt 110.4 lb

## 2023-12-12 DIAGNOSIS — J4489 Other specified chronic obstructive pulmonary disease: Secondary | ICD-10-CM | POA: Diagnosis not present

## 2023-12-12 LAB — HYPERSENSITIVITY PNUEMONITIS PROFILE
ASPERGILLUS FUMIGATUS: NEGATIVE
Faenia retivirgula: NEGATIVE
Pigeon Serum: NEGATIVE
S. VIRIDIS: NEGATIVE
T. CANDIDUS: NEGATIVE
T. VULGARIS: NEGATIVE

## 2023-12-12 MED ORDER — OMALIZUMAB 150 MG/ML ~~LOC~~ SOSY
300.0000 mg | PREFILLED_SYRINGE | Freq: Once | SUBCUTANEOUS | Status: DC
  Filled 2023-12-12: qty 2

## 2023-12-12 MED ORDER — OMALIZUMAB 150 MG/ML ~~LOC~~ SOSY
300.0000 mg | PREFILLED_SYRINGE | Freq: Once | SUBCUTANEOUS | Status: AC
  Administered 2023-12-12: 300 mg via SUBCUTANEOUS
  Filled 2023-12-12: qty 2

## 2023-12-12 NOTE — Progress Notes (Addendum)
 Diagnosis: , Asthma  Provider:  Mannam, Praveen MD  Procedure: Injection  Xolair  (Omalizumab ), Dose: 300 mg, Site: subcutaneous, Number of injections: 2  Injection Site(s): Left arm and Right arm  Post Care:    Discharge: Condition: Good, Destination: Home . AVS Provided  Performed by:  Lavergne Hiltunen, RN

## 2023-12-20 ENCOUNTER — Ambulatory Visit: Attending: Internal Medicine

## 2023-12-20 NOTE — Progress Notes (Signed)
 [facility]  Referring Provider: Dr. Geronimo  Virtual Visit via Telephone Note  I connected with Sara Trevino on 12/20/23 at  1:00 PM EST by telephone and verified that I am speaking with the correct person using two identifiers.  Location: Patient: home Provider: office   I discussed the limitations, risks, security and privacy concerns of performing an evaluation and management service by telephone and the availability of in person appointments. I also discussed with the patient that there may be a patient responsible charge related to this service. The patient expressed understanding and agreed to proceed.   HPI: Sara Trevino is a 58 y.o. female who presents to the pharmacotherapy clinic via telephone for Xolair  follow-up education and dose adjustment at request of Dr. Geronimo. PMH includes severe persistent asthma.   Patient was last seen by pulmonologist, Dr. Geronimo, on 12/06/23, when plan was to increase Xolair  dose and frequency to baseline dose every 2 weeks.   Baseline Xolair  dose when initially prescribed in 2022 was Xolair  375mg  [place] every 2 weeks.  In October 2022, dose was modified to Xolair  375mg  every 28 days (see OV note 10/28/20).  In November 2023, dose was modified to Xolair  300mg  every 28 days (see OV note 11/21/21) due to desire for use of fewer syringes.   Due to recent exacerbations, plan is to return to baseline dose per Dr. Geronimo.   Today, patient reports she is hesitant to both increase frequency to 2 weeks AND increase dose to 375mg . She reports she prefers to start with increased frequency and continue 300mg  dose.   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 11/02/2016   Abnormal CT of the chest 10/04/2016   Weight loss 09/25/2016   Nasal polyps 06/06/2015   Seasonal and perennial allergic  rhinitis 08/25/2014   Asthma-COPD overlap syndrome (HCC)     Patient's Medications  New Prescriptions   No medications on file  Previous Medications   ALBUTEROL  (PROAIR  HFA) 108 (90 BASE) MCG/ACT INHALER    INHALE 2 PUFFS INTO THE LUNGS EVERY 6 HOURS AS NEEDED FOR WHEEZING AND FOR SHORTNESS OF BREATH   AZITHROMYCIN  (ZITHROMAX ) 250 MG TABLET    2 today then one daily   BUDESONIDE -FORMOTEROL  (SYMBICORT ) 160-4.5 MCG/ACT INHALER    Inhale 2 puffs then rinse mouth, twice daily   BUDESONIDE -FORMOTEROL  (SYMBICORT ) 160-4.5 MCG/ACT INHALER    Inhale 2 puffs into the lungs 2 (two) times daily.   EPINEPHRINE  0.3 MG/0.3 ML IJ SOAJ INJECTION    Inject into thigh for severe allergic reaction.   MULTIPLE VITAMINS-MINERALS (MULTIVITAMIN PO)    Take 1 tablet by mouth daily.   RESPIRATORY THERAPY SUPPLIES (FLUTTER) DEVI    As directed   XOLAIR  150 MG/ML PREFILLED SYRINGE    INJECT 2 SYRINGES UNDER THE SKIN EVERY 4 WEEKS  Modified Medications   No medications on file  Discontinued Medications   No medications on file    Allergies: Allergies  Allergen Reactions   Amoxicillin -Pot Clavulanate Diarrhea    Past Medical History: Past Medical History:  Diagnosis Date   Allergy     Asthma    Cough    SOB (shortness of breath)     Social History: Social History   Socioeconomic History   Marital status: Married    Spouse name: Not on file   Number of children: 1   Years of education: Not on file  Highest education level: Not on file  Occupational History    Employer: [employer]    Comment: chemister  Tobacco Use   Smoking status: Never   Smokeless tobacco: Never  Vaping Use   Vaping status: Never Used  Substance and Sexual Activity   Alcohol use: No   Drug use: No   Sexual activity: Not on file  Other Topics Concern   Not on file  Social History Narrative   Not on file   Social Drivers of Health   Financial Resource Strain: Not on file  Food Insecurity: Not on file  Transportation  Needs: Not on file  Physical Activity: Not on file  Stress: Not on file  Social Connections: Not on file    Medication: Xolair  [place] injection  Current dose: 300mg  [place] every 28 days  Assessment: Baseline IgE prior to initiation of Xolair : 915 (02/04/2018) Wt 101 lb 12.8 oz (46.2 kg)  See telephone encounter thread 11/01/2018 - Xolair  375mg  [place] every 14 days recommended by Dr. Neysa.   Xolair  dose:  - Since initiation, the dose was gradually decreased in part due to patient preferences. Now with recent exacerbations, Dr. Reeves recommendation is to return to baseline dosing. - Ultimately, patient declines 375mg  dose. She is agreeable to increase frequency of dosing to every 2 weeks.   Patient brings forward additional concern: She asks if she needs helminth infection testing. - She denies current signs or symptoms of helminth infection - She denies recent helminth infections in the last several years - Reviewed that Xolair  package insert recommends monitoring for helminth in patients at high risk of helminth infections while taking Xolair . This was based on a study in [place] in which high risk was defined as follows: having had at least one of the following: documented history of geohelminth infection or living with a household member with a current or documented history of geohelminth infection within the previous year, or a positive RAST for Ascaris-specific IgE at screening and a self-reported geohelminth infection within the previous year. - From chart review and patient interview, she does not meet criteria for high risk and therefore testing is not necessary. Educated on s/sx of helminth infection which she will report to clinic if these occur.  Plan: - PharmD to change infusion center orders for Xolair  [place] injection to q2 weeks. She is not willing to increase 300mg  dose to 375mg  dose. Will alert Dr. Geronimo as RICK.  - Follow-up with Dr. Geronimo as planned on 02/28/2024  I  discussed the assessment and treatment plan with the patient. [name]   The patient was advised to call back or seek an in-person evaluation if the symptoms worsen or if the condition fails to improve as anticipated.  I provided 15 minutes of non-face-to-face time during this encounter.  Aleck Puls, PharmD, BCPS, CPP Clinical Pharmacist  [facility]  [facility]

## 2023-12-28 ENCOUNTER — Encounter: Payer: Self-pay | Admitting: Internal Medicine

## 2023-12-28 ENCOUNTER — Ambulatory Visit
Admission: RE | Admit: 2023-12-28 | Discharge: 2023-12-28 | Disposition: A | Source: Ambulatory Visit | Attending: Obstetrics & Gynecology | Admitting: Obstetrics & Gynecology

## 2023-12-28 DIAGNOSIS — Z1231 Encounter for screening mammogram for malignant neoplasm of breast: Secondary | ICD-10-CM

## 2023-12-28 NOTE — Addendum Note (Signed)
 Addended by: Alexianna Nachreiner L on: 12/28/2023 04:53 PM   Modules accepted: Orders

## 2023-12-31 ENCOUNTER — Telehealth: Payer: Self-pay | Admitting: Pharmacy Technician

## 2023-12-31 ENCOUNTER — Other Ambulatory Visit (HOSPITAL_COMMUNITY): Payer: Self-pay | Admitting: Internal Medicine

## 2023-12-31 NOTE — Telephone Encounter (Signed)
 [name], Xolair  has been approved.   Auth Submission: APPROVED PHARMACY BENEFIT Site of care: Site of care: [facility] Payer: [employer] Medication & CPT/J Code(s) submitted:  Diagnosis Code: J45.50 Route of submission (phone, fax, portal):  Phone # [phone] Fax # Auth type: PHARMACY BENEFIT CMM Units/visits requested: 300MG  Q2WKS Reference number: [id number] [place]  Approval from: 01/02/24 - 01/01/25

## 2024-01-02 ENCOUNTER — Encounter: Payer: Self-pay | Admitting: Internal Medicine

## 2024-01-10 ENCOUNTER — Ambulatory Visit: Payer: Self-pay | Admitting: Internal Medicine

## 2024-01-11 ENCOUNTER — Ambulatory Visit

## 2024-01-14 ENCOUNTER — Ambulatory Visit (INDEPENDENT_AMBULATORY_CARE_PROVIDER_SITE_OTHER): Admitting: *Deleted

## 2024-01-14 VITALS — BP 99/65 | HR 73 | Temp 98.2°F | Resp 16 | Ht 62.0 in | Wt 111.6 lb

## 2024-01-14 DIAGNOSIS — J4489 Other specified chronic obstructive pulmonary disease: Secondary | ICD-10-CM | POA: Diagnosis not present

## 2024-01-14 MED ORDER — OMALIZUMAB 150 MG/ML ~~LOC~~ SOSY
300.0000 mg | PREFILLED_SYRINGE | Freq: Once | SUBCUTANEOUS | Status: AC
  Administered 2024-01-14: 300 mg via SUBCUTANEOUS
  Filled 2024-01-14: qty 2

## 2024-01-14 NOTE — Progress Notes (Signed)
 Diagnosis:  Asthma  Provider:  Mannam, Praveen MD  Procedure: Injection  Xolair  (Omalizumab ), Dose: 300 mg, Site: subcutaneous, Number of injections: 2  Injection Site(s): Right arm and Left arm  Post Care: Observation period completed  Discharge: Condition: Good, Destination: Home . AVS Provided  Performed by:  Trudy Lamarr LABOR, RN

## 2024-01-28 ENCOUNTER — Ambulatory Visit

## 2024-01-31 ENCOUNTER — Ambulatory Visit

## 2024-01-31 VITALS — BP 116/77 | HR 83 | Temp 97.6°F | Resp 14 | Ht 62.0 in | Wt 110.6 lb

## 2024-01-31 DIAGNOSIS — J4489 Other specified chronic obstructive pulmonary disease: Secondary | ICD-10-CM | POA: Diagnosis not present

## 2024-01-31 MED ORDER — OMALIZUMAB 150 MG/ML ~~LOC~~ SOSY
300.0000 mg | PREFILLED_SYRINGE | Freq: Once | SUBCUTANEOUS | Status: AC
  Administered 2024-01-31: 300 mg via SUBCUTANEOUS
  Filled 2024-01-31: qty 2

## 2024-01-31 NOTE — Progress Notes (Signed)
 Diagnosis: Asthma  Provider:  Mannam, Praveen MD  Procedure: Injection  Xolair  (Omalizumab ), Dose: 300 mg, Site: subcutaneous, Number of injections: 2  Injection Site(s): Left arm and Right arm  Post Care: left and right arm injection  Discharge: Condition: Good, Destination: Home . AVS Declined  Performed by:  Maximiano JONELLE Pouch, LPN

## 2024-02-01 ENCOUNTER — Ambulatory Visit

## 2024-02-15 ENCOUNTER — Ambulatory Visit

## 2024-02-21 ENCOUNTER — Ambulatory Visit

## 2024-02-21 VITALS — BP 118/77 | HR 71 | Temp 97.8°F | Resp 18 | Ht 62.0 in | Wt 110.4 lb

## 2024-02-21 DIAGNOSIS — J4489 Other specified chronic obstructive pulmonary disease: Secondary | ICD-10-CM

## 2024-02-21 MED ORDER — OMALIZUMAB 150 MG/ML ~~LOC~~ SOSY
300.0000 mg | PREFILLED_SYRINGE | Freq: Once | SUBCUTANEOUS | Status: AC
  Administered 2024-02-21: 300 mg via SUBCUTANEOUS
  Filled 2024-02-21: qty 2

## 2024-02-21 NOTE — Progress Notes (Signed)
 Diagnosis: Asthma  Provider:  Mannam, Praveen MD  Procedure: Injection  Xolair  (Omalizumab ), Dose: 300 mg, Site: subcutaneous, Number of injections: 1  Injection Site(s): Left arm  Post Care: Patient declined observation  Discharge: Condition: Good, Destination: Home . AVS Provided  Performed by:  Vic Esco, RN

## 2024-02-28 ENCOUNTER — Encounter

## 2024-02-28 ENCOUNTER — Ambulatory Visit: Admitting: Internal Medicine

## 2024-03-13 ENCOUNTER — Ambulatory Visit
